# Patient Record
Sex: Female | Born: 1965 | Race: White | Hispanic: No | Marital: Married
Health system: Southern US, Community
[De-identification: ages and names within clinical notes are randomized; demographics above are authoritative.]

---

## 2010-05-23 ENCOUNTER — Encounter: Admission: RE | Admit: 2010-05-23 | Discharge: 2010-05-23 | Payer: Self-pay | Admitting: Unknown Physician Specialty

## 2011-08-18 IMAGING — CR DG FOOT COMPLETE 3+V*L*
3 series · 3 of 3 positions shown · non-contrast
Comparison: None.

CLINICAL DATA: Left foot pain, no known injury, the patient does
play tennis

LEFT FOOT - COMPLETE 3+ VIEW

[view not recorded (1 of 3)]
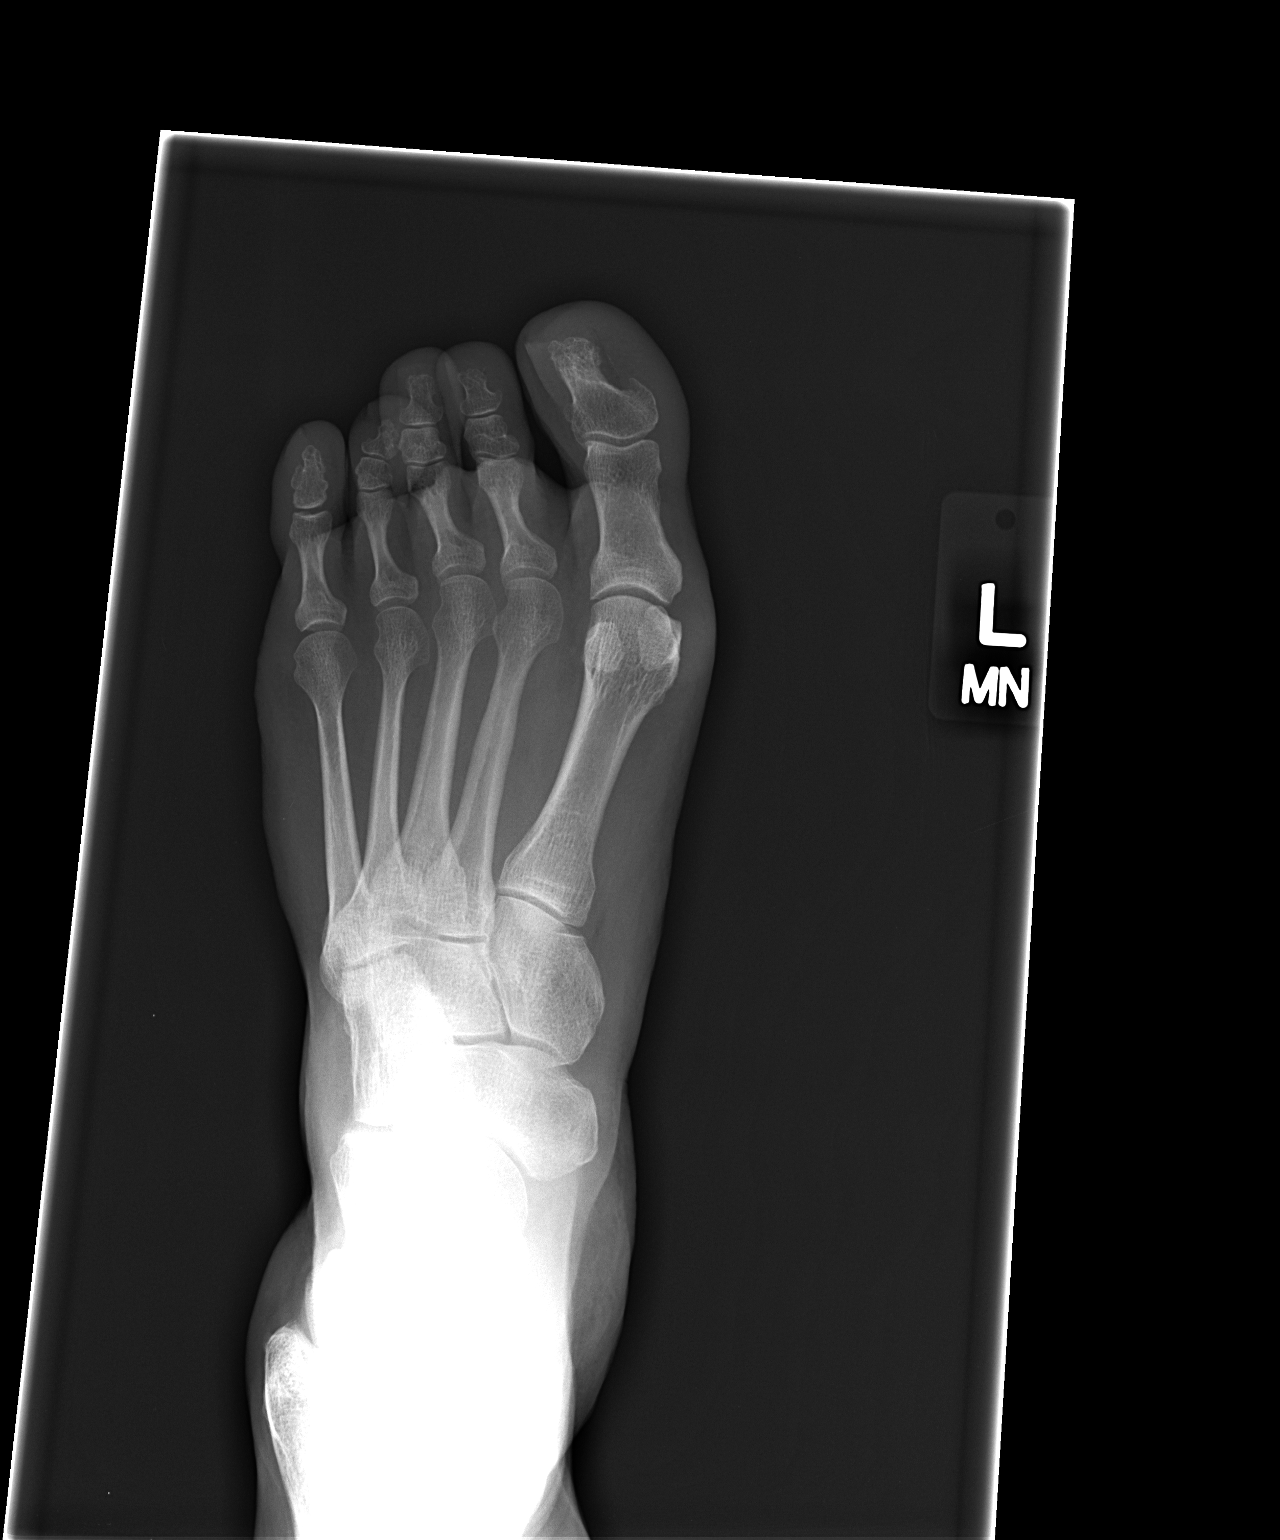

[view not recorded (2 of 3)]
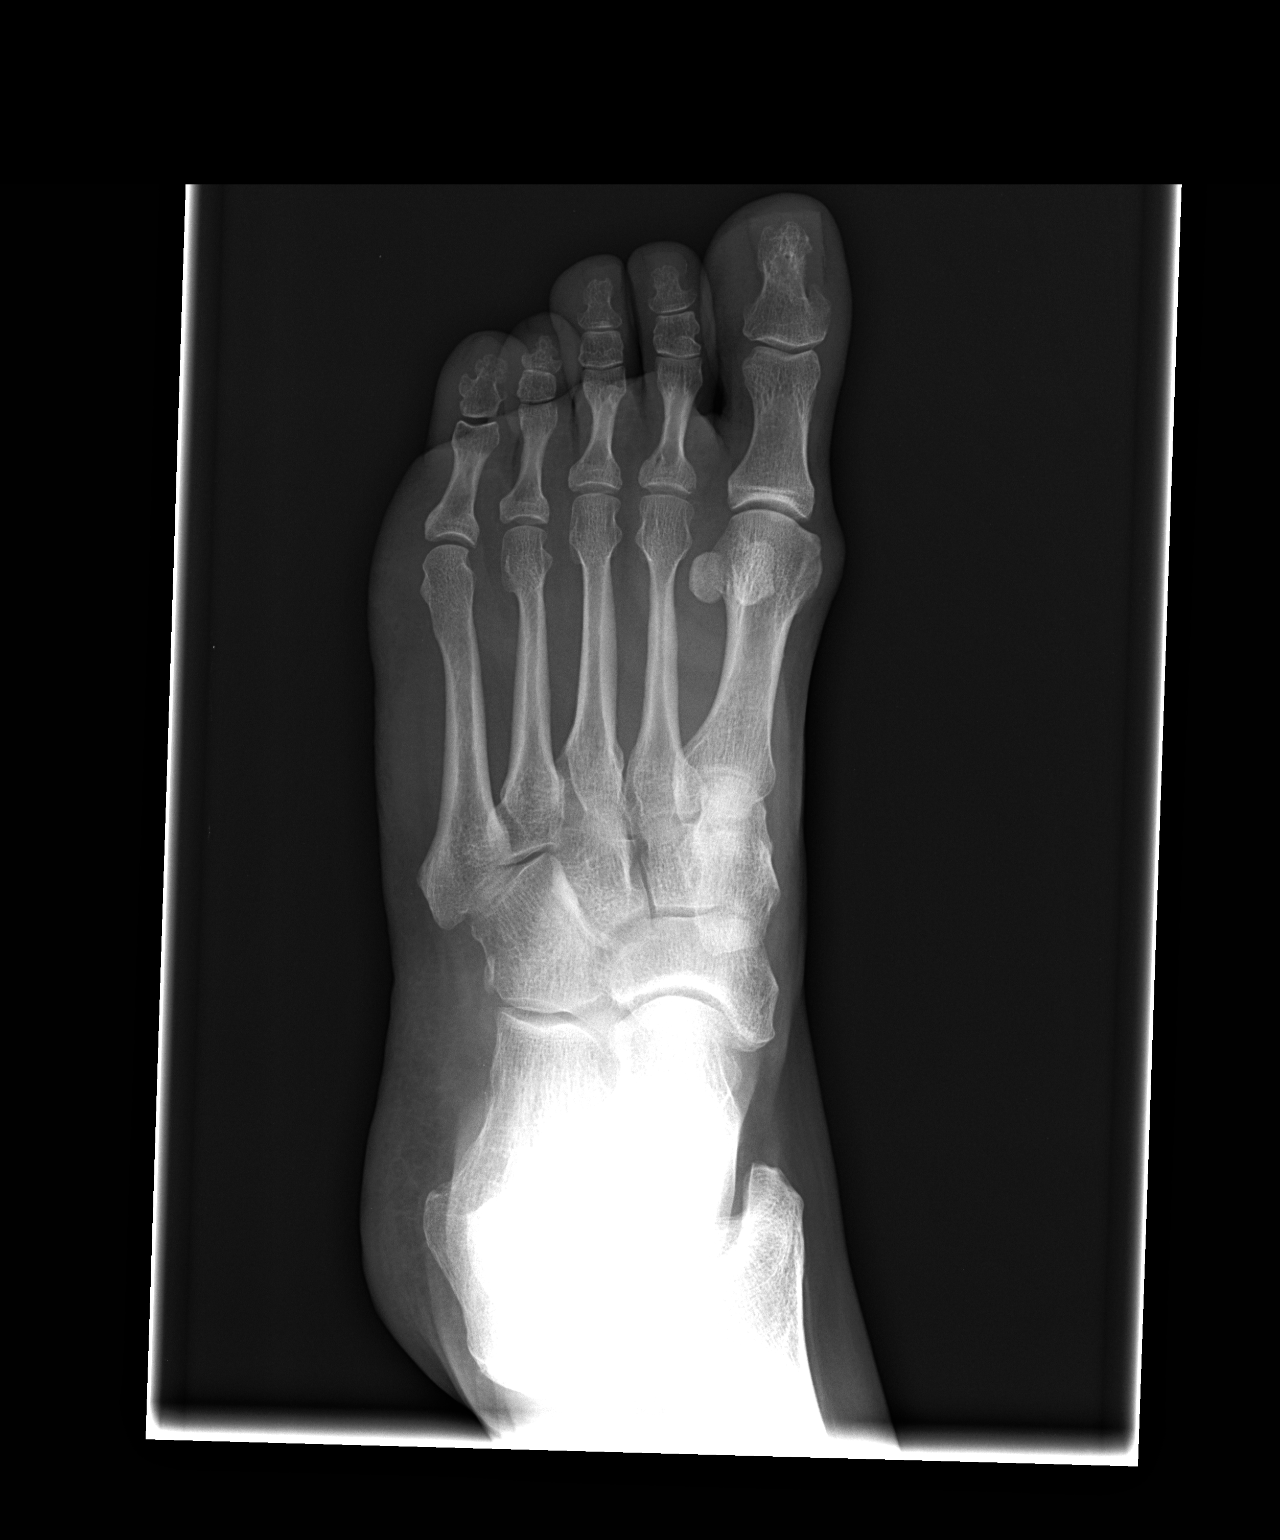

[view not recorded (3 of 3)]
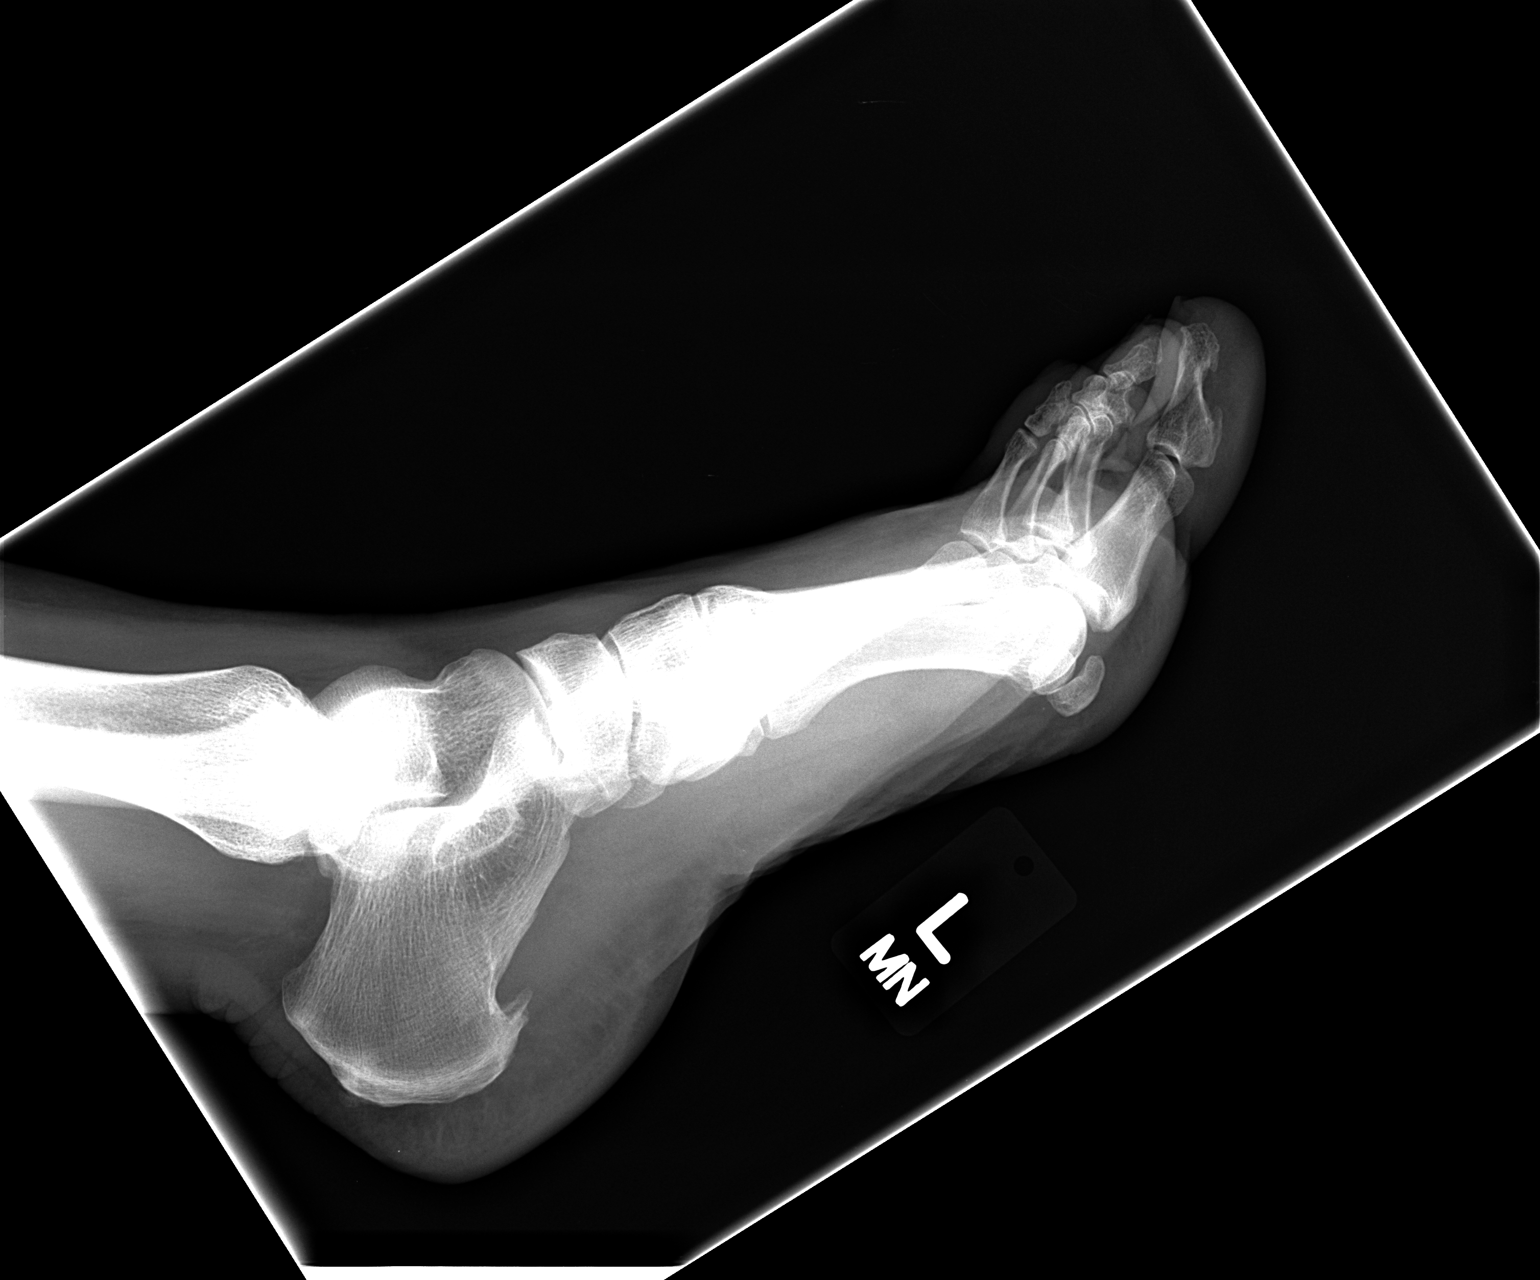

[3 of 3 positions shown; findings below may reference images not displayed]

FINDINGS: No acute fracture is seen.  Tarsal - metatarsal alignment
is normal.  Joint spaces appear normal.  No erosion is seen.  There
is a plantar calcaneal degenerative spur present.
IMPRESSION: No acute abnormality.  Plantar calcaneal degenerative spur.

## 2017-04-16 ENCOUNTER — Other Ambulatory Visit (HOSPITAL_COMMUNITY): Payer: Self-pay

## 2017-04-17 ENCOUNTER — Ambulatory Visit (HOSPITAL_COMMUNITY)
Admission: RE | Admit: 2017-04-17 | Discharge: 2017-04-17 | Disposition: A | Discharge status: Home / Self Care | Payer: Managed Care, Other (non HMO) | Source: Ambulatory Visit | Attending: Nephrology | Admitting: Nephrology

## 2017-04-17 DIAGNOSIS — N189 Chronic kidney disease, unspecified: Secondary | ICD-10-CM | POA: Insufficient documentation

## 2017-04-17 DIAGNOSIS — D631 Anemia in chronic kidney disease: Secondary | ICD-10-CM | POA: Diagnosis not present

## 2017-04-17 MED ORDER — SODIUM CHLORIDE 0.9 % IV SOLN
510.0000 mg | INTRAVENOUS | Status: DC
  Administered 2017-04-17: 510 mg via INTRAVENOUS
  Filled 2017-04-17: qty 17

## 2017-04-17 NOTE — Discharge Instructions (Signed)

## 2017-04-25 ENCOUNTER — Ambulatory Visit (HOSPITAL_COMMUNITY)
Admission: RE | Admit: 2017-04-25 | Discharge: 2017-04-25 | Disposition: A | Discharge status: Home / Self Care | Payer: Managed Care, Other (non HMO) | Source: Ambulatory Visit | Attending: Nephrology | Admitting: Nephrology

## 2017-04-25 DIAGNOSIS — N184 Chronic kidney disease, stage 4 (severe): Secondary | ICD-10-CM | POA: Diagnosis not present

## 2017-04-25 DIAGNOSIS — D631 Anemia in chronic kidney disease: Secondary | ICD-10-CM | POA: Diagnosis not present

## 2017-04-25 MED ORDER — SODIUM CHLORIDE 0.9 % IV SOLN
510.0000 mg | INTRAVENOUS | Status: DC
  Administered 2017-04-25: 510 mg via INTRAVENOUS
  Filled 2017-04-25: qty 17

## 2017-05-08 ENCOUNTER — Other Ambulatory Visit (HOSPITAL_COMMUNITY): Payer: Self-pay | Admitting: *Deleted

## 2017-05-09 ENCOUNTER — Ambulatory Visit (HOSPITAL_COMMUNITY)
Admission: RE | Admit: 2017-05-09 | Discharge: 2017-05-09 | Disposition: A | Discharge status: Home / Self Care | Payer: Managed Care, Other (non HMO) | Source: Ambulatory Visit | Attending: Nephrology | Admitting: Nephrology

## 2017-05-09 ENCOUNTER — Encounter (HOSPITAL_COMMUNITY): Payer: Managed Care, Other (non HMO)

## 2017-05-09 DIAGNOSIS — N189 Chronic kidney disease, unspecified: Secondary | ICD-10-CM | POA: Insufficient documentation

## 2017-05-09 DIAGNOSIS — D631 Anemia in chronic kidney disease: Secondary | ICD-10-CM | POA: Insufficient documentation

## 2017-05-09 LAB — POCT HEMOGLOBIN-HEMACUE: HEMOGLOBIN: 9.4 g/dL — AB (ref 12.0–15.0)

## 2017-05-09 MED ORDER — EPOETIN ALFA 10000 UNIT/ML IJ SOLN
INTRAMUSCULAR | Status: AC
  Administered 2017-05-09: 10000 [IU] via SUBCUTANEOUS
  Filled 2017-05-09: qty 1

## 2017-05-09 MED ORDER — EPOETIN ALFA 10000 UNIT/ML IJ SOLN: 30000.0000 [IU] | INTRAMUSCULAR | Status: DC

## 2017-05-09 MED ORDER — EPOETIN ALFA 20000 UNIT/ML IJ SOLN
INTRAMUSCULAR | Status: AC
  Administered 2017-05-09: 20000 [IU] via SUBCUTANEOUS
  Filled 2017-05-09: qty 1

## 2017-05-09 NOTE — Discharge Instructions (Signed)
Epoetin Alfa injection °What is this medicine? °EPOETIN ALFA (e POE e tin AL fa) helps your body make more red blood cells. This medicine is used to treat anemia caused by chronic kidney failure, cancer chemotherapy, or HIV-therapy. It may also be used before surgery if you have anemia. °This medicine may be used for other purposes; ask your health care provider or pharmacist if you have questions. °COMMON BRAND NAME(S): Epogen, Procrit °What should I tell my health care provider before I take this medicine? °They need to know if you have any of these conditions: °-blood clotting disorders °-cancer patient not on chemotherapy °-cystic fibrosis °-heart disease, such as angina or heart failure °-hemoglobin level of 12 g/dL or greater °-high blood pressure °-low levels of folate, iron, or vitamin B12 °-seizures °-an unusual or allergic reaction to erythropoietin, albumin, benzyl alcohol, hamster proteins, other medicines, foods, dyes, or preservatives °-pregnant or trying to get pregnant °-breast-feeding °How should I use this medicine? °This medicine is for injection into a vein or under the skin. It is usually given by a health care professional in a hospital or clinic setting. °If you get this medicine at home, you will be taught how to prepare and give this medicine. Use exactly as directed. Take your medicine at regular intervals. Do not take your medicine more often than directed. °It is important that you put your used needles and syringes in a special sharps container. Do not put them in a trash can. If you do not have a sharps container, call your pharmacist or healthcare provider to get one. °A special MedGuide will be given to you by the pharmacist with each prescription and refill. Be sure to read this information carefully each time. °Talk to your pediatrician regarding the use of this medicine in children. While this drug may be prescribed for selected conditions, precautions do apply. °Overdosage: If you  think you have taken too much of this medicine contact a poison control center or emergency room at once. °NOTE: This medicine is only for you. Do not share this medicine with others. °What if I miss a dose? °If you miss a dose, take it as soon as you can. If it is almost time for your next dose, take only that dose. Do not take double or extra doses. °What may interact with this medicine? °Do not take this medicine with any of the following medications: °-darbepoetin alfa °This list may not describe all possible interactions. Give your health care provider a list of all the medicines, herbs, non-prescription drugs, or dietary supplements you use. Also tell them if you smoke, drink alcohol, or use illegal drugs. Some items may interact with your medicine. °What should I watch for while using this medicine? °Your condition will be monitored carefully while you are receiving this medicine. °You may need blood work done while you are taking this medicine. °What side effects may I notice from receiving this medicine? °Side effects that you should report to your doctor or health care professional as soon as possible: °-allergic reactions like skin rash, itching or hives, swelling of the face, lips, or tongue °-breathing problems °-changes in vision °-chest pain °-confusion, trouble speaking or understanding °-feeling faint or lightheaded, falls °-high blood pressure °-muscle aches or pains °-pain, swelling, warmth in the leg °-rapid weight gain °-severe headaches °-sudden numbness or weakness of the face, arm or leg °-trouble walking, dizziness, loss of balance or coordination °-seizures (convulsions) °-swelling of the ankles, feet, hands °-unusually weak or tired °  Side effects that usually do not require medical attention (report to your doctor or health care professional if they continue or are bothersome): °-diarrhea °-fever, chills (flu-like symptoms) °-headaches °-nausea, vomiting °-redness, stinging, or swelling at  site where injected °This list may not describe all possible side effects. Call your doctor for medical advice about side effects. You may report side effects to FDA at 1-800-FDA-1088. °Where should I keep my medicine? °Keep out of the reach of children. °Store in a refrigerator between 2 and 8 degrees C (36 and 46 degrees F). Do not freeze or shake. Throw away any unused portion if using a single-dose vial. Multi-dose vials can be kept in the refrigerator for up to 21 days after the initial dose. Throw away unused medicine. °NOTE: This sheet is a summary. It may not cover all possible information. If you have questions about this medicine, talk to your doctor, pharmacist, or health care provider. °© 2018 [employer] (2016-04-23 19:42:31) ° °

## 2017-05-23 ENCOUNTER — Ambulatory Visit (HOSPITAL_COMMUNITY)
Admission: RE | Admit: 2017-05-23 | Discharge: 2017-05-23 | Disposition: A | Discharge status: Home / Self Care | Payer: Managed Care, Other (non HMO) | Source: Ambulatory Visit | Attending: Nephrology | Admitting: Nephrology

## 2017-05-23 DIAGNOSIS — D631 Anemia in chronic kidney disease: Secondary | ICD-10-CM | POA: Diagnosis present

## 2017-05-23 DIAGNOSIS — N189 Chronic kidney disease, unspecified: Secondary | ICD-10-CM | POA: Insufficient documentation

## 2017-05-23 LAB — POCT HEMOGLOBIN-HEMACUE: HEMOGLOBIN: 10.5 g/dL — AB (ref 12.0–15.0)

## 2017-05-23 MED ORDER — EPOETIN ALFA 20000 UNIT/ML IJ SOLN
INTRAMUSCULAR | Status: AC
  Administered 2017-05-23: 20000 [IU] via SUBCUTANEOUS
  Filled 2017-05-23: qty 1

## 2017-05-23 MED ORDER — EPOETIN ALFA 20000 UNIT/ML IJ SOLN: 30000.0000 [IU] | INTRAMUSCULAR | Status: DC

## 2017-05-23 MED ORDER — EPOETIN ALFA 10000 UNIT/ML IJ SOLN
INTRAMUSCULAR | Status: AC
  Administered 2017-05-23: 10000 [IU] via SUBCUTANEOUS
  Filled 2017-05-23: qty 1

## 2017-05-30 ENCOUNTER — Encounter (HOSPITAL_COMMUNITY)
Admission: RE | Admit: 2017-05-30 | Discharge: 2017-05-30 | Disposition: A | Discharge status: Home / Self Care | Payer: Managed Care, Other (non HMO) | Source: Ambulatory Visit | Attending: Nephrology | Admitting: Nephrology

## 2017-05-30 DIAGNOSIS — D631 Anemia in chronic kidney disease: Secondary | ICD-10-CM | POA: Diagnosis present

## 2017-05-30 DIAGNOSIS — N189 Chronic kidney disease, unspecified: Secondary | ICD-10-CM | POA: Insufficient documentation

## 2017-05-30 LAB — POCT HEMOGLOBIN-HEMACUE: HEMOGLOBIN: 11.3 g/dL — AB (ref 12.0–15.0)

## 2017-05-30 MED ORDER — EPOETIN ALFA 20000 UNIT/ML IJ SOLN
INTRAMUSCULAR | Status: AC
  Administered 2017-05-30: 20000 [IU]
  Filled 2017-05-30: qty 1

## 2017-05-30 MED ORDER — EPOETIN ALFA 10000 UNIT/ML IJ SOLN
INTRAMUSCULAR | Status: AC
  Administered 2017-05-30: 10000 [IU]
  Filled 2017-05-30: qty 1

## 2017-05-30 MED ORDER — EPOETIN ALFA 10000 UNIT/ML IJ SOLN: 30000.0000 [IU] | INTRAMUSCULAR | Status: DC

## 2017-06-05 ENCOUNTER — Other Ambulatory Visit (HOSPITAL_COMMUNITY): Payer: Self-pay | Admitting: *Deleted

## 2017-06-06 ENCOUNTER — Encounter (HOSPITAL_COMMUNITY)
Admission: RE | Admit: 2017-06-06 | Discharge: 2017-06-06 | Disposition: A | Discharge status: Home / Self Care | Payer: Managed Care, Other (non HMO) | Source: Ambulatory Visit | Attending: Nephrology | Admitting: Nephrology

## 2017-06-06 DIAGNOSIS — N189 Chronic kidney disease, unspecified: Secondary | ICD-10-CM | POA: Diagnosis not present

## 2017-06-06 LAB — IRON AND TIBC
Iron: 32 ug/dL (ref 28–170)
SATURATION RATIOS: 13 % (ref 10.4–31.8)
TIBC: 249 ug/dL — ABNORMAL LOW (ref 250–450)
UIBC: 217 ug/dL

## 2017-06-06 LAB — FERRITIN: Ferritin: 165 ng/mL (ref 11–307)

## 2017-06-06 LAB — POCT HEMOGLOBIN-HEMACUE: Hemoglobin: 12.6 g/dL (ref 12.0–15.0)

## 2017-06-06 MED ORDER — EPOETIN ALFA 20000 UNIT/ML IJ SOLN: 30000.0000 [IU] | Freq: Once | INTRAMUSCULAR | Status: DC

## 2017-06-13 ENCOUNTER — Encounter: Payer: Self-pay | Admitting: Nephrology

## 2017-06-13 DIAGNOSIS — N189 Chronic kidney disease, unspecified: Secondary | ICD-10-CM

## 2017-06-13 DIAGNOSIS — N184 Chronic kidney disease, stage 4 (severe): Secondary | ICD-10-CM | POA: Insufficient documentation

## 2017-06-13 DIAGNOSIS — Z862 Personal history of diseases of the blood and blood-forming organs and certain disorders involving the immune mechanism: Secondary | ICD-10-CM | POA: Insufficient documentation

## 2017-06-19 ENCOUNTER — Encounter (HOSPITAL_COMMUNITY)
Admission: RE | Admit: 2017-06-19 | Discharge: 2017-06-19 | Disposition: A | Discharge status: Home / Self Care | Payer: Managed Care, Other (non HMO) | Source: Ambulatory Visit | Attending: Nephrology | Admitting: Nephrology

## 2017-06-19 DIAGNOSIS — N189 Chronic kidney disease, unspecified: Secondary | ICD-10-CM | POA: Insufficient documentation

## 2017-06-19 DIAGNOSIS — N184 Chronic kidney disease, stage 4 (severe): Secondary | ICD-10-CM

## 2017-06-19 DIAGNOSIS — D631 Anemia in chronic kidney disease: Secondary | ICD-10-CM | POA: Insufficient documentation

## 2017-06-19 LAB — POCT HEMOGLOBIN-HEMACUE: HEMOGLOBIN: 12.8 g/dL (ref 12.0–15.0)

## 2017-06-19 MED ORDER — EPOETIN ALFA 40000 UNIT/ML IJ SOLN: 30000.0000 [IU] | INTRAMUSCULAR | Status: DC

## 2017-07-03 ENCOUNTER — Encounter (HOSPITAL_COMMUNITY)
Admission: RE | Admit: 2017-07-03 | Discharge: 2017-07-03 | Disposition: A | Discharge status: Home / Self Care | Payer: Managed Care, Other (non HMO) | Source: Ambulatory Visit | Attending: Nephrology | Admitting: Nephrology

## 2017-07-03 DIAGNOSIS — N184 Chronic kidney disease, stage 4 (severe): Secondary | ICD-10-CM

## 2017-07-03 DIAGNOSIS — N189 Chronic kidney disease, unspecified: Secondary | ICD-10-CM | POA: Diagnosis not present

## 2017-07-03 LAB — IRON AND TIBC
IRON: 48 ug/dL (ref 28–170)
SATURATION RATIOS: 20 % (ref 10.4–31.8)
TIBC: 237 ug/dL — AB (ref 250–450)
UIBC: 189 ug/dL

## 2017-07-03 LAB — POCT HEMOGLOBIN-HEMACUE: Hemoglobin: 11.8 g/dL — ABNORMAL LOW (ref 12.0–15.0)

## 2017-07-03 LAB — FERRITIN: Ferritin: 273 ng/mL (ref 11–307)

## 2017-07-03 MED ORDER — EPOETIN ALFA 10000 UNIT/ML IJ SOLN
INTRAMUSCULAR | Status: AC
  Administered 2017-07-03: 15:00:00 10000 [IU] via SUBCUTANEOUS
  Filled 2017-07-03: qty 1

## 2017-07-03 MED ORDER — EPOETIN ALFA 40000 UNIT/ML IJ SOLN: 30000.0000 [IU] | INTRAMUSCULAR | Status: DC

## 2017-07-03 MED ORDER — EPOETIN ALFA 20000 UNIT/ML IJ SOLN
INTRAMUSCULAR | Status: AC
  Administered 2017-07-03: 15:00:00 20000 [IU] via SUBCUTANEOUS
  Filled 2017-07-03: qty 1

## 2017-07-10 ENCOUNTER — Encounter (HOSPITAL_COMMUNITY)
Admission: RE | Admit: 2017-07-10 | Discharge: 2017-07-10 | Disposition: A | Discharge status: Home / Self Care | Payer: Managed Care, Other (non HMO) | Source: Ambulatory Visit | Attending: Nephrology | Admitting: Nephrology

## 2017-07-10 DIAGNOSIS — N189 Chronic kidney disease, unspecified: Secondary | ICD-10-CM | POA: Diagnosis not present

## 2017-07-10 DIAGNOSIS — N184 Chronic kidney disease, stage 4 (severe): Secondary | ICD-10-CM

## 2017-07-10 LAB — POCT HEMOGLOBIN-HEMACUE: Hemoglobin: 12.3 g/dL (ref 12.0–15.0)

## 2017-07-10 MED ORDER — EPOETIN ALFA 40000 UNIT/ML IJ SOLN: 30000.0000 [IU] | INTRAMUSCULAR | Status: DC

## 2017-07-24 ENCOUNTER — Encounter (HOSPITAL_COMMUNITY)
Admission: RE | Admit: 2017-07-24 | Discharge: 2017-07-24 | Disposition: A | Discharge status: Home / Self Care | Payer: Managed Care, Other (non HMO) | Source: Ambulatory Visit | Attending: Nephrology | Admitting: Nephrology

## 2017-07-24 VITALS — BP 131/82 | HR 90 | Temp 97.8°F | Resp 18

## 2017-07-24 DIAGNOSIS — N184 Chronic kidney disease, stage 4 (severe): Secondary | ICD-10-CM

## 2017-07-24 DIAGNOSIS — N189 Chronic kidney disease, unspecified: Secondary | ICD-10-CM | POA: Insufficient documentation

## 2017-07-24 DIAGNOSIS — D631 Anemia in chronic kidney disease: Secondary | ICD-10-CM | POA: Insufficient documentation

## 2017-07-24 LAB — POCT HEMOGLOBIN-HEMACUE: Hemoglobin: 11.9 g/dL — ABNORMAL LOW (ref 12.0–15.0)

## 2017-07-24 MED ORDER — EPOETIN ALFA 40000 UNIT/ML IJ SOLN: 30000.0000 [IU] | INTRAMUSCULAR | Status: DC

## 2017-07-24 MED ORDER — EPOETIN ALFA 20000 UNIT/ML IJ SOLN
INTRAMUSCULAR | Status: AC
  Administered 2017-07-24: 15:00:00 20000 [IU]
  Filled 2017-07-24: qty 1

## 2017-07-24 MED ORDER — EPOETIN ALFA 10000 UNIT/ML IJ SOLN
INTRAMUSCULAR | Status: AC
  Administered 2017-07-24: 10000 [IU]
  Filled 2017-07-24: qty 1

## 2017-08-01 ENCOUNTER — Encounter (HOSPITAL_COMMUNITY)
Admission: RE | Admit: 2017-08-01 | Discharge: 2017-08-01 | Disposition: A | Discharge status: Home / Self Care | Payer: Managed Care, Other (non HMO) | Source: Ambulatory Visit | Attending: Nephrology | Admitting: Nephrology

## 2017-08-01 VITALS — BP 129/88 | HR 79 | Temp 98.2°F | Resp 18

## 2017-08-01 DIAGNOSIS — N189 Chronic kidney disease, unspecified: Secondary | ICD-10-CM | POA: Diagnosis not present

## 2017-08-01 DIAGNOSIS — N184 Chronic kidney disease, stage 4 (severe): Secondary | ICD-10-CM

## 2017-08-01 LAB — IRON AND TIBC
Iron: 53 ug/dL (ref 28–170)
Saturation Ratios: 21 % (ref 10.4–31.8)
TIBC: 256 ug/dL (ref 250–450)
UIBC: 203 ug/dL

## 2017-08-01 LAB — FERRITIN: FERRITIN: 162 ng/mL (ref 11–307)

## 2017-08-01 LAB — POCT HEMOGLOBIN-HEMACUE: Hemoglobin: 12.7 g/dL (ref 12.0–15.0)

## 2017-08-01 MED ORDER — EPOETIN ALFA 40000 UNIT/ML IJ SOLN: 30000.0000 [IU] | INTRAMUSCULAR | Status: DC

## 2017-08-15 ENCOUNTER — Encounter (HOSPITAL_COMMUNITY)
Admission: RE | Admit: 2017-08-15 | Discharge: 2017-08-15 | Disposition: A | Discharge status: Home / Self Care | Payer: Managed Care, Other (non HMO) | Source: Ambulatory Visit | Attending: Nephrology | Admitting: Nephrology

## 2017-08-15 VITALS — BP 133/89 | HR 98 | Resp 18

## 2017-08-15 DIAGNOSIS — N189 Chronic kidney disease, unspecified: Secondary | ICD-10-CM | POA: Diagnosis not present

## 2017-08-15 DIAGNOSIS — N184 Chronic kidney disease, stage 4 (severe): Secondary | ICD-10-CM

## 2017-08-15 LAB — POCT HEMOGLOBIN-HEMACUE: HEMOGLOBIN: 11.9 g/dL — AB (ref 12.0–15.0)

## 2017-08-15 MED ORDER — EPOETIN ALFA 40000 UNIT/ML IJ SOLN: 30000.0000 [IU] | INTRAMUSCULAR | Status: DC

## 2017-08-15 MED ORDER — EPOETIN ALFA 20000 UNIT/ML IJ SOLN
INTRAMUSCULAR | Status: AC
  Administered 2017-08-15: 15:00:00 20000 [IU] via SUBCUTANEOUS
  Filled 2017-08-15: qty 1

## 2017-08-15 MED ORDER — EPOETIN ALFA 10000 UNIT/ML IJ SOLN
INTRAMUSCULAR | Status: AC
  Administered 2017-08-15: 10000 [IU] via SUBCUTANEOUS
  Filled 2017-08-15: qty 1

## 2017-08-23 ENCOUNTER — Other Ambulatory Visit (HOSPITAL_COMMUNITY): Payer: Self-pay | Admitting: *Deleted

## 2017-08-26 ENCOUNTER — Encounter (HOSPITAL_COMMUNITY): Payer: Managed Care, Other (non HMO)

## 2017-08-29 ENCOUNTER — Encounter (HOSPITAL_COMMUNITY)
Admission: RE | Admit: 2017-08-29 | Discharge: 2017-08-29 | Disposition: A | Discharge status: Home / Self Care | Payer: Managed Care, Other (non HMO) | Source: Ambulatory Visit | Attending: Nephrology | Admitting: Nephrology

## 2017-08-29 VITALS — BP 127/74 | HR 94 | Temp 97.4°F | Resp 18

## 2017-08-29 DIAGNOSIS — N189 Chronic kidney disease, unspecified: Secondary | ICD-10-CM | POA: Insufficient documentation

## 2017-08-29 DIAGNOSIS — N184 Chronic kidney disease, stage 4 (severe): Secondary | ICD-10-CM

## 2017-08-29 DIAGNOSIS — D631 Anemia in chronic kidney disease: Secondary | ICD-10-CM | POA: Insufficient documentation

## 2017-08-29 LAB — IRON AND TIBC
IRON: 79 ug/dL (ref 28–170)
Saturation Ratios: 28 % (ref 10.4–31.8)
TIBC: 287 ug/dL (ref 250–450)
UIBC: 208 ug/dL

## 2017-08-29 LAB — POCT HEMOGLOBIN-HEMACUE: Hemoglobin: 12 g/dL (ref 12.0–15.0)

## 2017-08-29 LAB — FERRITIN: Ferritin: 219 ng/mL (ref 11–307)

## 2017-08-29 MED ORDER — EPOETIN ALFA 40000 UNIT/ML IJ SOLN: 30000.0000 [IU] | INTRAMUSCULAR | Status: DC

## 2017-09-12 ENCOUNTER — Encounter (HOSPITAL_COMMUNITY)
Admission: RE | Admit: 2017-09-12 | Discharge: 2017-09-12 | Disposition: A | Discharge status: Home / Self Care | Payer: Managed Care, Other (non HMO) | Source: Ambulatory Visit | Attending: Nephrology | Admitting: Nephrology

## 2017-09-12 VITALS — BP 120/80 | HR 88 | Resp 18

## 2017-09-12 DIAGNOSIS — N189 Chronic kidney disease, unspecified: Secondary | ICD-10-CM | POA: Diagnosis not present

## 2017-09-12 DIAGNOSIS — N184 Chronic kidney disease, stage 4 (severe): Secondary | ICD-10-CM

## 2017-09-12 LAB — POCT HEMOGLOBIN-HEMACUE: HEMOGLOBIN: 11.9 g/dL — AB (ref 12.0–15.0)

## 2017-09-12 MED ORDER — EPOETIN ALFA 20000 UNIT/ML IJ SOLN
INTRAMUSCULAR | Status: AC
  Administered 2017-09-12: 10:00:00 20000 [IU]
  Filled 2017-09-12: qty 1

## 2017-09-12 MED ORDER — EPOETIN ALFA 40000 UNIT/ML IJ SOLN: 30000.0000 [IU] | INTRAMUSCULAR | Status: DC

## 2017-09-12 MED ORDER — EPOETIN ALFA 10000 UNIT/ML IJ SOLN
INTRAMUSCULAR | Status: AC
  Administered 2017-09-12: 10:00:00 10000 [IU]
  Filled 2017-09-12: qty 1

## 2017-09-13 ENCOUNTER — Encounter (HOSPITAL_COMMUNITY): Payer: Managed Care, Other (non HMO)

## 2017-09-16 ENCOUNTER — Other Ambulatory Visit (HOSPITAL_COMMUNITY): Payer: Self-pay | Admitting: *Deleted

## 2017-09-18 ENCOUNTER — Encounter (HOSPITAL_COMMUNITY)
Admission: RE | Admit: 2017-09-18 | Discharge: 2017-09-18 | Disposition: A | Discharge status: Home / Self Care | Payer: Managed Care, Other (non HMO) | Source: Ambulatory Visit | Attending: Nephrology | Admitting: Nephrology

## 2017-09-18 VITALS — BP 130/81 | HR 99 | Temp 97.8°F | Resp 18

## 2017-09-18 DIAGNOSIS — D631 Anemia in chronic kidney disease: Secondary | ICD-10-CM | POA: Insufficient documentation

## 2017-09-18 DIAGNOSIS — N189 Chronic kidney disease, unspecified: Secondary | ICD-10-CM | POA: Diagnosis not present

## 2017-09-18 DIAGNOSIS — N184 Chronic kidney disease, stage 4 (severe): Secondary | ICD-10-CM

## 2017-09-18 LAB — POCT HEMOGLOBIN-HEMACUE: HEMOGLOBIN: 12.1 g/dL (ref 12.0–15.0)

## 2017-09-18 MED ORDER — EPOETIN ALFA 40000 UNIT/ML IJ SOLN: 30000.0000 [IU] | INTRAMUSCULAR | Status: DC

## 2017-09-18 MED ORDER — EPOETIN ALFA 20000 UNIT/ML IJ SOLN
INTRAMUSCULAR | Status: AC
  Filled 2017-09-18: qty 1

## 2017-09-18 MED ORDER — EPOETIN ALFA 10000 UNIT/ML IJ SOLN
INTRAMUSCULAR | Status: AC
  Filled 2017-09-18: qty 1

## 2017-10-02 ENCOUNTER — Encounter (HOSPITAL_COMMUNITY)
Admission: RE | Admit: 2017-10-02 | Discharge: 2017-10-02 | Disposition: A | Discharge status: Home / Self Care | Payer: Managed Care, Other (non HMO) | Source: Ambulatory Visit | Attending: Nephrology | Admitting: Nephrology

## 2017-10-02 VITALS — BP 119/74 | HR 97 | Temp 98.0°F | Resp 20

## 2017-10-02 DIAGNOSIS — N189 Chronic kidney disease, unspecified: Secondary | ICD-10-CM | POA: Diagnosis not present

## 2017-10-02 DIAGNOSIS — N184 Chronic kidney disease, stage 4 (severe): Secondary | ICD-10-CM

## 2017-10-02 LAB — POCT HEMOGLOBIN-HEMACUE: HEMOGLOBIN: 11.2 g/dL — AB (ref 12.0–15.0)

## 2017-10-02 LAB — IRON AND TIBC
Iron: 68 ug/dL (ref 28–170)
Saturation Ratios: 28 % (ref 10.4–31.8)
TIBC: 245 ug/dL — ABNORMAL LOW (ref 250–450)
UIBC: 177 ug/dL

## 2017-10-02 LAB — FERRITIN: FERRITIN: 216 ng/mL (ref 11–307)

## 2017-10-02 MED ORDER — EPOETIN ALFA 10000 UNIT/ML IJ SOLN
INTRAMUSCULAR | Status: AC
  Administered 2017-10-02: 15:00:00 10000 [IU] via SUBCUTANEOUS
  Filled 2017-10-02: qty 1

## 2017-10-02 MED ORDER — EPOETIN ALFA 20000 UNIT/ML IJ SOLN
INTRAMUSCULAR | Status: AC
  Administered 2017-10-02: 15:00:00 20000 [IU] via SUBCUTANEOUS
  Filled 2017-10-02: qty 1

## 2017-10-02 MED ORDER — EPOETIN ALFA 40000 UNIT/ML IJ SOLN: 30000.0000 [IU] | INTRAMUSCULAR | Status: DC

## 2017-10-09 ENCOUNTER — Encounter (HOSPITAL_COMMUNITY)
Admission: RE | Admit: 2017-10-09 | Discharge: 2017-10-09 | Disposition: A | Discharge status: Home / Self Care | Payer: Managed Care, Other (non HMO) | Source: Ambulatory Visit | Attending: Nephrology | Admitting: Nephrology

## 2017-10-09 VITALS — BP 122/79 | HR 85 | Resp 16

## 2017-10-09 DIAGNOSIS — N189 Chronic kidney disease, unspecified: Secondary | ICD-10-CM | POA: Diagnosis not present

## 2017-10-09 DIAGNOSIS — N184 Chronic kidney disease, stage 4 (severe): Secondary | ICD-10-CM

## 2017-10-09 LAB — POCT HEMOGLOBIN-HEMACUE: HEMOGLOBIN: 11.9 g/dL — AB (ref 12.0–15.0)

## 2017-10-09 MED ORDER — EPOETIN ALFA 10000 UNIT/ML IJ SOLN
INTRAMUSCULAR | Status: AC
  Administered 2017-10-09: 15:00:00 10000 [IU]
  Filled 2017-10-09: qty 1

## 2017-10-09 MED ORDER — EPOETIN ALFA 20000 UNIT/ML IJ SOLN
INTRAMUSCULAR | Status: AC
  Administered 2017-10-09: 15:00:00 20000 [IU]
  Filled 2017-10-09: qty 1

## 2017-10-09 MED ORDER — EPOETIN ALFA 40000 UNIT/ML IJ SOLN: 30000.0000 [IU] | INTRAMUSCULAR | Status: DC

## 2017-10-16 ENCOUNTER — Encounter (HOSPITAL_COMMUNITY)
Admission: RE | Admit: 2017-10-16 | Discharge: 2017-10-16 | Disposition: A | Discharge status: Home / Self Care | Payer: Managed Care, Other (non HMO) | Source: Ambulatory Visit | Attending: Nephrology | Admitting: Nephrology

## 2017-10-16 VITALS — BP 120/76 | HR 86 | Temp 97.8°F | Resp 18

## 2017-10-16 DIAGNOSIS — N184 Chronic kidney disease, stage 4 (severe): Secondary | ICD-10-CM

## 2017-10-16 DIAGNOSIS — N189 Chronic kidney disease, unspecified: Secondary | ICD-10-CM | POA: Diagnosis not present

## 2017-10-16 MED ORDER — EPOETIN ALFA 40000 UNIT/ML IJ SOLN: 30000.0000 [IU] | INTRAMUSCULAR | Status: DC

## 2017-10-17 LAB — POCT HEMOGLOBIN-HEMACUE: Hemoglobin: 12.7 g/dL (ref 12.0–15.0)

## 2017-10-31 ENCOUNTER — Encounter (HOSPITAL_COMMUNITY)
Admission: RE | Admit: 2017-10-31 | Discharge: 2017-10-31 | Disposition: A | Discharge status: Home / Self Care | Payer: Managed Care, Other (non HMO) | Source: Ambulatory Visit | Attending: Nephrology | Admitting: Nephrology

## 2017-10-31 VITALS — BP 118/75 | HR 89 | Temp 98.6°F | Resp 18

## 2017-10-31 DIAGNOSIS — D631 Anemia in chronic kidney disease: Secondary | ICD-10-CM | POA: Insufficient documentation

## 2017-10-31 DIAGNOSIS — N189 Chronic kidney disease, unspecified: Secondary | ICD-10-CM | POA: Insufficient documentation

## 2017-10-31 DIAGNOSIS — N184 Chronic kidney disease, stage 4 (severe): Secondary | ICD-10-CM

## 2017-10-31 LAB — IRON AND TIBC
IRON: 76 ug/dL (ref 28–170)
SATURATION RATIOS: 28 % (ref 10.4–31.8)
TIBC: 269 ug/dL (ref 250–450)
UIBC: 193 ug/dL

## 2017-10-31 LAB — POCT HEMOGLOBIN-HEMACUE: Hemoglobin: 12.4 g/dL (ref 12.0–15.0)

## 2017-10-31 LAB — FERRITIN: Ferritin: 190 ng/mL (ref 11–307)

## 2017-10-31 MED ORDER — EPOETIN ALFA 40000 UNIT/ML IJ SOLN: 30000.0000 [IU] | INTRAMUSCULAR | Status: DC

## 2017-11-14 ENCOUNTER — Encounter (HOSPITAL_COMMUNITY)
Admission: RE | Admit: 2017-11-14 | Discharge: 2017-11-14 | Disposition: A | Discharge status: Home / Self Care | Payer: Managed Care, Other (non HMO) | Source: Ambulatory Visit | Attending: Nephrology | Admitting: Nephrology

## 2017-11-14 VITALS — BP 128/84 | HR 88 | Resp 18

## 2017-11-14 DIAGNOSIS — N189 Chronic kidney disease, unspecified: Secondary | ICD-10-CM | POA: Diagnosis not present

## 2017-11-14 DIAGNOSIS — N184 Chronic kidney disease, stage 4 (severe): Secondary | ICD-10-CM

## 2017-11-14 LAB — POCT HEMOGLOBIN-HEMACUE: Hemoglobin: 11.5 g/dL — ABNORMAL LOW (ref 12.0–15.0)

## 2017-11-14 MED ORDER — EPOETIN ALFA 40000 UNIT/ML IJ SOLN: 30000.0000 [IU] | INTRAMUSCULAR | Status: DC

## 2017-11-14 MED ORDER — EPOETIN ALFA 10000 UNIT/ML IJ SOLN
INTRAMUSCULAR | Status: AC
  Administered 2017-11-14: 15:00:00 10000 [IU]
  Filled 2017-11-14: qty 1

## 2017-11-14 MED ORDER — EPOETIN ALFA 20000 UNIT/ML IJ SOLN
INTRAMUSCULAR | Status: AC
  Administered 2017-11-14: 15:00:00 20000 [IU]
  Filled 2017-11-14: qty 1

## 2017-11-20 ENCOUNTER — Encounter (HOSPITAL_COMMUNITY): Payer: Managed Care, Other (non HMO)

## 2017-11-21 ENCOUNTER — Encounter (HOSPITAL_COMMUNITY): Payer: Managed Care, Other (non HMO)

## 2017-11-27 ENCOUNTER — Encounter (HOSPITAL_COMMUNITY)
Admission: RE | Admit: 2017-11-27 | Discharge: 2017-11-27 | Disposition: A | Discharge status: Home / Self Care | Payer: Managed Care, Other (non HMO) | Source: Ambulatory Visit | Attending: Nephrology | Admitting: Nephrology

## 2017-11-27 VITALS — BP 110/71 | HR 78 | Temp 98.2°F | Resp 18

## 2017-11-27 DIAGNOSIS — D631 Anemia in chronic kidney disease: Secondary | ICD-10-CM | POA: Insufficient documentation

## 2017-11-27 DIAGNOSIS — N184 Chronic kidney disease, stage 4 (severe): Secondary | ICD-10-CM

## 2017-11-27 DIAGNOSIS — N189 Chronic kidney disease, unspecified: Secondary | ICD-10-CM | POA: Insufficient documentation

## 2017-11-27 LAB — FERRITIN: FERRITIN: 165 ng/mL (ref 11–307)

## 2017-11-27 LAB — IRON AND TIBC
IRON: 68 ug/dL (ref 28–170)
Saturation Ratios: 28 % (ref 10.4–31.8)
TIBC: 244 ug/dL — AB (ref 250–450)
UIBC: 176 ug/dL

## 2017-11-27 LAB — POCT HEMOGLOBIN-HEMACUE: HEMOGLOBIN: 12.3 g/dL (ref 12.0–15.0)

## 2017-11-27 MED ORDER — EPOETIN ALFA 40000 UNIT/ML IJ SOLN: 30000.0000 [IU] | INTRAMUSCULAR | Status: DC

## 2017-12-04 ENCOUNTER — Encounter (HOSPITAL_COMMUNITY): Payer: Managed Care, Other (non HMO)

## 2017-12-11 ENCOUNTER — Encounter (HOSPITAL_COMMUNITY): Payer: Managed Care, Other (non HMO)

## 2017-12-12 ENCOUNTER — Ambulatory Visit (HOSPITAL_COMMUNITY)
Admission: RE | Admit: 2017-12-12 | Discharge: 2017-12-12 | Disposition: A | Discharge status: Home / Self Care | Payer: Managed Care, Other (non HMO) | Source: Ambulatory Visit | Attending: Nephrology | Admitting: Nephrology

## 2017-12-12 VITALS — BP 136/92 | HR 88 | Resp 18

## 2017-12-12 DIAGNOSIS — N184 Chronic kidney disease, stage 4 (severe): Secondary | ICD-10-CM | POA: Diagnosis not present

## 2017-12-12 LAB — POCT HEMOGLOBIN-HEMACUE: HEMOGLOBIN: 12.1 g/dL (ref 12.0–15.0)

## 2017-12-12 MED ORDER — EPOETIN ALFA 40000 UNIT/ML IJ SOLN: 30000.0000 [IU] | INTRAMUSCULAR | Status: DC

## 2017-12-19 ENCOUNTER — Encounter (HOSPITAL_COMMUNITY): Payer: Managed Care, Other (non HMO)

## 2017-12-25 ENCOUNTER — Ambulatory Visit (HOSPITAL_COMMUNITY)
Admission: RE | Admit: 2017-12-25 | Discharge: 2017-12-25 | Disposition: A | Discharge status: Home / Self Care | Payer: Managed Care, Other (non HMO) | Source: Ambulatory Visit | Attending: Nephrology | Admitting: Nephrology

## 2017-12-25 VITALS — BP 121/75 | HR 87 | Temp 98.2°F | Resp 18

## 2017-12-25 DIAGNOSIS — N184 Chronic kidney disease, stage 4 (severe): Secondary | ICD-10-CM | POA: Diagnosis not present

## 2017-12-25 DIAGNOSIS — D631 Anemia in chronic kidney disease: Secondary | ICD-10-CM | POA: Diagnosis present

## 2017-12-25 LAB — IRON AND TIBC
Iron: 73 ug/dL (ref 28–170)
Saturation Ratios: 30 % (ref 10.4–31.8)
TIBC: 244 ug/dL — ABNORMAL LOW (ref 250–450)
UIBC: 171 ug/dL

## 2017-12-25 LAB — POCT HEMOGLOBIN-HEMACUE: Hemoglobin: 10.7 g/dL — ABNORMAL LOW (ref 12.0–15.0)

## 2017-12-25 LAB — FERRITIN: FERRITIN: 241 ng/mL (ref 11–307)

## 2017-12-25 MED ORDER — EPOETIN ALFA 10000 UNIT/ML IJ SOLN
INTRAMUSCULAR | Status: AC
  Administered 2017-12-25: 10000 [IU]
  Filled 2017-12-25: qty 1

## 2017-12-25 MED ORDER — EPOETIN ALFA 40000 UNIT/ML IJ SOLN: 30000.0000 [IU] | INTRAMUSCULAR | Status: DC

## 2017-12-25 MED ORDER — EPOETIN ALFA 20000 UNIT/ML IJ SOLN
INTRAMUSCULAR | Status: AC
  Administered 2017-12-25: 20000 [IU]
  Filled 2017-12-25: qty 1

## 2017-12-27 LAB — POCT HEMOGLOBIN-HEMACUE
HEMOGLOBIN: 10.7 g/dL — AB (ref 12.0–15.0)
Hemoglobin: 11.3 g/dL — ABNORMAL LOW (ref 12.0–15.0)
Hemoglobin: 10.2 g/dL — ABNORMAL LOW (ref 12.0–15.0)

## 2018-01-01 ENCOUNTER — Encounter (HOSPITAL_COMMUNITY): Payer: Managed Care, Other (non HMO)

## 2018-01-02 ENCOUNTER — Telehealth: Payer: Self-pay

## 2018-01-02 NOTE — Telephone Encounter (Signed)
Sent referral to scheduling 

## 2018-01-08 ENCOUNTER — Ambulatory Visit (HOSPITAL_COMMUNITY)
Admission: RE | Admit: 2018-01-08 | Discharge: 2018-01-08 | Disposition: A | Discharge status: Home / Self Care | Payer: Managed Care, Other (non HMO) | Source: Ambulatory Visit | Attending: Nephrology | Admitting: Nephrology

## 2018-01-08 VITALS — BP 120/87 | HR 86 | Temp 98.3°F | Resp 18

## 2018-01-08 DIAGNOSIS — D631 Anemia in chronic kidney disease: Secondary | ICD-10-CM | POA: Insufficient documentation

## 2018-01-08 DIAGNOSIS — N184 Chronic kidney disease, stage 4 (severe): Secondary | ICD-10-CM | POA: Diagnosis not present

## 2018-01-08 LAB — POCT HEMOGLOBIN-HEMACUE: HEMOGLOBIN: 11.6 g/dL — AB (ref 12.0–15.0)

## 2018-01-08 MED ORDER — EPOETIN ALFA 10000 UNIT/ML IJ SOLN
INTRAMUSCULAR | Status: AC
  Administered 2018-01-08: 10000 [IU]
  Filled 2018-01-08: qty 1

## 2018-01-08 MED ORDER — EPOETIN ALFA 20000 UNIT/ML IJ SOLN
INTRAMUSCULAR | Status: AC
  Administered 2018-01-08: 15:00:00 20000 [IU]
  Filled 2018-01-08: qty 1

## 2018-01-08 MED ORDER — EPOETIN ALFA 40000 UNIT/ML IJ SOLN
30000.0000 [IU] | INTRAMUSCULAR | Status: DC
Start: 2018-01-08 — End: 2018-01-08

## 2018-01-09 ENCOUNTER — Other Ambulatory Visit: Payer: Self-pay | Admitting: Nephrology

## 2018-01-09 DIAGNOSIS — Z1231 Encounter for screening mammogram for malignant neoplasm of breast: Secondary | ICD-10-CM

## 2018-01-14 ENCOUNTER — Other Ambulatory Visit (HOSPITAL_COMMUNITY): Payer: Self-pay | Admitting: Nephrology

## 2018-01-14 DIAGNOSIS — Z7682 Awaiting organ transplant status: Secondary | ICD-10-CM

## 2018-01-15 ENCOUNTER — Ambulatory Visit (HOSPITAL_COMMUNITY)
Admission: RE | Admit: 2018-01-15 | Discharge: 2018-01-15 | Disposition: A | Discharge status: Home / Self Care | Payer: Managed Care, Other (non HMO) | Source: Ambulatory Visit | Attending: Nephrology | Admitting: Nephrology

## 2018-01-15 ENCOUNTER — Telehealth (HOSPITAL_COMMUNITY): Payer: Self-pay | Admitting: Nephrology

## 2018-01-15 VITALS — BP 126/85 | HR 76 | Resp 18

## 2018-01-15 DIAGNOSIS — D631 Anemia in chronic kidney disease: Secondary | ICD-10-CM | POA: Insufficient documentation

## 2018-01-15 DIAGNOSIS — N184 Chronic kidney disease, stage 4 (severe): Secondary | ICD-10-CM | POA: Diagnosis present

## 2018-01-15 MED ORDER — EPOETIN ALFA 40000 UNIT/ML IJ SOLN: 30000.0000 [IU] | INTRAMUSCULAR | Status: DC

## 2018-01-16 ENCOUNTER — Telehealth (HOSPITAL_COMMUNITY): Payer: Self-pay | Admitting: *Deleted

## 2018-01-16 LAB — POCT HEMOGLOBIN-HEMACUE: HEMOGLOBIN: 12 g/dL (ref 12.0–15.0)

## 2018-01-16 NOTE — Telephone Encounter (Signed)
Left message on voicemail in reference to upcoming appointment scheduled for 01/22/18. Phone number given for a call back so details instructions can be given. Regina Byrd

## 2018-01-21 ENCOUNTER — Ambulatory Visit (HOSPITAL_COMMUNITY): Payer: Managed Care, Other (non HMO) | Attending: Cardiovascular Disease

## 2018-01-21 ENCOUNTER — Telehealth (HOSPITAL_COMMUNITY): Payer: Self-pay | Admitting: Nephrology

## 2018-01-21 ENCOUNTER — Other Ambulatory Visit: Payer: Self-pay

## 2018-01-21 DIAGNOSIS — D631 Anemia in chronic kidney disease: Secondary | ICD-10-CM | POA: Diagnosis not present

## 2018-01-21 DIAGNOSIS — E785 Hyperlipidemia, unspecified: Secondary | ICD-10-CM | POA: Insufficient documentation

## 2018-01-21 DIAGNOSIS — Z8639 Personal history of other endocrine, nutritional and metabolic disease: Secondary | ICD-10-CM | POA: Diagnosis not present

## 2018-01-21 DIAGNOSIS — Z7682 Awaiting organ transplant status: Secondary | ICD-10-CM | POA: Diagnosis not present

## 2018-01-21 DIAGNOSIS — N184 Chronic kidney disease, stage 4 (severe): Secondary | ICD-10-CM | POA: Insufficient documentation

## 2018-01-21 DIAGNOSIS — I129 Hypertensive chronic kidney disease with stage 1 through stage 4 chronic kidney disease, or unspecified chronic kidney disease: Secondary | ICD-10-CM | POA: Diagnosis not present

## 2018-01-21 DIAGNOSIS — N281 Cyst of kidney, acquired: Secondary | ICD-10-CM | POA: Insufficient documentation

## 2018-01-21 NOTE — Telephone Encounter (Signed)
User: Cherie Dark A Date/time: 01/17/18 2:55 PM  Comment: Called pt and lmsg for her to CB to r/s echo .Marland KitchenRG  Context:  Outcome: Left Message  Phone number: [phone] Phone Type: Home Phone  Comm. type: Telephone Call type: Outgoing  Contact: Lynwood Dawley Relation to patient: Self    User: Cherie Dark A Date/time: 01/17/18 10:24 AM  Comment: Called pt and lmsg for her to CB to r/s echo appt on 01/22/18.Marland KitchenRG  Context:  Outcome: Left Message  Phone number: [phone] Phone Type: Home Phone  Comm. type: Telephone Call type: Outgoing  Contact: Lynwood Dawley Relation to patient: Self    User: Cherie Dark A Date/time: 01/15/18 11:49 AM  Comment: Called pt and lmsg for her to Cb to move echo appt to another day   Context:  Outcome: Left Message  Phone number: [phone] Phone Type: Home Phone  Comm. type: Telephone Call type: Outgoing  Contact: Lynwood Dawley Relation to patient: Self

## 2018-01-22 ENCOUNTER — Other Ambulatory Visit (HOSPITAL_COMMUNITY): Payer: Managed Care, Other (non HMO)

## 2018-01-22 NOTE — Telephone Encounter (Signed)
User: Cherie Dark A Date/time: 01/21/18 3:39 PM  Comment: Called Deanna twice today to check to see if authorization had been started for patient's stress echo appt that is tomorrow at 2:30. I left 2 messages and is currently awaiting a response.   Context:  Outcome: Left Message  Phone number: [phone] Phone Type:   Comm. type: Telephone Call type: Outgoing  Contact: Greenwald Kidney Relation to patient: Provider

## 2018-01-29 ENCOUNTER — Encounter (HOSPITAL_COMMUNITY)
Admission: RE | Admit: 2018-01-29 | Discharge: 2018-01-29 | Disposition: A | Discharge status: Home / Self Care | Payer: Managed Care, Other (non HMO) | Source: Ambulatory Visit | Attending: Nephrology | Admitting: Nephrology

## 2018-01-29 VITALS — BP 135/91 | HR 73 | Temp 97.9°F | Resp 18

## 2018-01-29 DIAGNOSIS — D631 Anemia in chronic kidney disease: Secondary | ICD-10-CM | POA: Diagnosis present

## 2018-01-29 DIAGNOSIS — N184 Chronic kidney disease, stage 4 (severe): Secondary | ICD-10-CM

## 2018-01-29 DIAGNOSIS — N189 Chronic kidney disease, unspecified: Secondary | ICD-10-CM | POA: Insufficient documentation

## 2018-01-29 LAB — IRON AND TIBC
IRON: 69 ug/dL (ref 28–170)
Saturation Ratios: 27 % (ref 10.4–31.8)
TIBC: 259 ug/dL (ref 250–450)
UIBC: 190 ug/dL

## 2018-01-29 LAB — POCT HEMOGLOBIN-HEMACUE: HEMOGLOBIN: 11.6 g/dL — AB (ref 12.0–15.0)

## 2018-01-29 LAB — FERRITIN: FERRITIN: 215 ng/mL (ref 11–307)

## 2018-01-29 MED ORDER — EPOETIN ALFA 20000 UNIT/ML IJ SOLN
INTRAMUSCULAR | Status: AC
  Administered 2018-01-29: 20000 [IU] via SUBCUTANEOUS
  Filled 2018-01-29: qty 1

## 2018-01-29 MED ORDER — EPOETIN ALFA 40000 UNIT/ML IJ SOLN: 30000.0000 [IU] | INTRAMUSCULAR | Status: DC

## 2018-01-29 MED ORDER — EPOETIN ALFA 10000 UNIT/ML IJ SOLN
INTRAMUSCULAR | Status: AC
  Administered 2018-01-29: 10000 [IU] via SUBCUTANEOUS
  Filled 2018-01-29: qty 1

## 2018-02-05 ENCOUNTER — Ambulatory Visit (HOSPITAL_COMMUNITY)
Admission: RE | Admit: 2018-02-05 | Discharge: 2018-02-05 | Disposition: A | Discharge status: Home / Self Care | Payer: Managed Care, Other (non HMO) | Source: Ambulatory Visit | Attending: Nephrology | Admitting: Nephrology

## 2018-02-05 VITALS — BP 127/90 | HR 78 | Temp 97.9°F | Resp 18

## 2018-02-05 DIAGNOSIS — D631 Anemia in chronic kidney disease: Secondary | ICD-10-CM | POA: Insufficient documentation

## 2018-02-05 DIAGNOSIS — N184 Chronic kidney disease, stage 4 (severe): Secondary | ICD-10-CM | POA: Insufficient documentation

## 2018-02-05 LAB — POCT HEMOGLOBIN-HEMACUE: Hemoglobin: 12.2 g/dL (ref 12.0–15.0)

## 2018-02-05 MED ORDER — EPOETIN ALFA 40000 UNIT/ML IJ SOLN: 30000.0000 [IU] | INTRAMUSCULAR | Status: DC

## 2018-02-12 ENCOUNTER — Other Ambulatory Visit (HOSPITAL_COMMUNITY): Payer: Self-pay

## 2018-02-13 ENCOUNTER — Ambulatory Visit (HOSPITAL_COMMUNITY): Payer: Managed Care, Other (non HMO)

## 2018-02-19 ENCOUNTER — Ambulatory Visit (HOSPITAL_COMMUNITY)
Admission: RE | Admit: 2018-02-19 | Discharge: 2018-02-19 | Disposition: A | Discharge status: Home / Self Care | Payer: Managed Care, Other (non HMO) | Source: Ambulatory Visit | Attending: Nephrology | Admitting: Nephrology

## 2018-02-19 VITALS — BP 136/81 | HR 71 | Temp 97.9°F | Resp 18 | Ht 60.5 in | Wt 179.0 lb

## 2018-02-19 DIAGNOSIS — D631 Anemia in chronic kidney disease: Secondary | ICD-10-CM | POA: Diagnosis not present

## 2018-02-19 DIAGNOSIS — Z5181 Encounter for therapeutic drug level monitoring: Secondary | ICD-10-CM | POA: Insufficient documentation

## 2018-02-19 DIAGNOSIS — N184 Chronic kidney disease, stage 4 (severe): Secondary | ICD-10-CM | POA: Diagnosis present

## 2018-02-19 DIAGNOSIS — Z79899 Other long term (current) drug therapy: Secondary | ICD-10-CM | POA: Insufficient documentation

## 2018-02-19 LAB — POCT HEMOGLOBIN-HEMACUE: Hemoglobin: 11.7 g/dL — ABNORMAL LOW (ref 12.0–15.0)

## 2018-02-19 MED ORDER — SODIUM CHLORIDE 0.9 % IV SOLN
510.0000 mg | Freq: Once | INTRAVENOUS | Status: AC
  Administered 2018-02-19: 510 mg via INTRAVENOUS
  Filled 2018-02-19: qty 17

## 2018-02-19 MED ORDER — EPOETIN ALFA 20000 UNIT/ML IJ SOLN
INTRAMUSCULAR | Status: AC
  Administered 2018-02-19: 20000 [IU] via SUBCUTANEOUS
  Filled 2018-02-19: qty 1

## 2018-02-19 MED ORDER — EPOETIN ALFA 10000 UNIT/ML IJ SOLN
INTRAMUSCULAR | Status: AC
  Administered 2018-02-19: 10000 [IU] via SUBCUTANEOUS
  Filled 2018-02-19: qty 1

## 2018-02-19 MED ORDER — EPOETIN ALFA 40000 UNIT/ML IJ SOLN: 30000.0000 [IU] | INTRAMUSCULAR | Status: DC

## 2018-02-20 ENCOUNTER — Encounter (HOSPITAL_COMMUNITY): Payer: Managed Care, Other (non HMO)

## 2018-02-25 ENCOUNTER — Other Ambulatory Visit (HOSPITAL_COMMUNITY): Payer: Self-pay

## 2018-02-26 ENCOUNTER — Ambulatory Visit (HOSPITAL_COMMUNITY)
Admission: RE | Admit: 2018-02-26 | Discharge: 2018-02-26 | Disposition: A | Discharge status: Home / Self Care | Payer: Managed Care, Other (non HMO) | Source: Ambulatory Visit | Attending: Nephrology | Admitting: Nephrology

## 2018-02-26 DIAGNOSIS — Z79899 Other long term (current) drug therapy: Secondary | ICD-10-CM | POA: Diagnosis not present

## 2018-02-26 DIAGNOSIS — D631 Anemia in chronic kidney disease: Secondary | ICD-10-CM | POA: Insufficient documentation

## 2018-02-26 DIAGNOSIS — Z5181 Encounter for therapeutic drug level monitoring: Secondary | ICD-10-CM | POA: Insufficient documentation

## 2018-02-26 DIAGNOSIS — N184 Chronic kidney disease, stage 4 (severe): Secondary | ICD-10-CM | POA: Diagnosis not present

## 2018-02-26 LAB — IRON AND TIBC
IRON: 50 ug/dL (ref 28–170)
Saturation Ratios: 21 % (ref 10.4–31.8)
TIBC: 237 ug/dL — AB (ref 250–450)
UIBC: 187 ug/dL

## 2018-02-26 LAB — POCT HEMOGLOBIN-HEMACUE: HEMOGLOBIN: 12.1 g/dL (ref 12.0–15.0)

## 2018-02-26 LAB — FERRITIN: FERRITIN: 404 ng/mL — AB (ref 11–307)

## 2018-02-26 MED ORDER — EPOETIN ALFA 40000 UNIT/ML IJ SOLN: 30000.0000 [IU] | INTRAMUSCULAR | Status: DC

## 2018-03-05 ENCOUNTER — Encounter (HOSPITAL_COMMUNITY): Payer: Managed Care, Other (non HMO)

## 2018-03-11 ENCOUNTER — Other Ambulatory Visit (HOSPITAL_COMMUNITY): Payer: Self-pay

## 2018-03-12 ENCOUNTER — Ambulatory Visit (HOSPITAL_COMMUNITY)
Admission: RE | Admit: 2018-03-12 | Discharge: 2018-03-12 | Disposition: A | Discharge status: Home / Self Care | Payer: Managed Care, Other (non HMO) | Source: Ambulatory Visit | Attending: Nephrology | Admitting: Nephrology

## 2018-03-12 ENCOUNTER — Encounter (HOSPITAL_COMMUNITY): Payer: Managed Care, Other (non HMO)

## 2018-03-12 VITALS — BP 128/96 | HR 74 | Temp 98.2°F | Resp 18 | Ht 61.0 in | Wt 179.0 lb

## 2018-03-12 DIAGNOSIS — D631 Anemia in chronic kidney disease: Secondary | ICD-10-CM | POA: Diagnosis not present

## 2018-03-12 DIAGNOSIS — N184 Chronic kidney disease, stage 4 (severe): Secondary | ICD-10-CM

## 2018-03-12 LAB — POCT HEMOGLOBIN-HEMACUE: HEMOGLOBIN: 12.9 g/dL (ref 12.0–15.0)

## 2018-03-12 MED ORDER — EPOETIN ALFA 40000 UNIT/ML IJ SOLN: 30000.0000 [IU] | INTRAMUSCULAR | Status: DC

## 2018-03-12 MED ORDER — SODIUM CHLORIDE 0.9 % IV SOLN
510.0000 mg | Freq: Once | INTRAVENOUS | Status: AC
  Administered 2018-03-12: 510 mg via INTRAVENOUS
  Filled 2018-03-12: qty 17

## 2018-03-19 ENCOUNTER — Encounter (HOSPITAL_COMMUNITY): Payer: Managed Care, Other (non HMO)

## 2018-03-26 ENCOUNTER — Encounter (HOSPITAL_COMMUNITY): Payer: Managed Care, Other (non HMO)

## 2018-03-27 ENCOUNTER — Ambulatory Visit (HOSPITAL_COMMUNITY)
Admission: RE | Admit: 2018-03-27 | Discharge: 2018-03-27 | Disposition: A | Discharge status: Home / Self Care | Payer: Managed Care, Other (non HMO) | Source: Ambulatory Visit | Attending: Nephrology | Admitting: Nephrology

## 2018-03-27 VITALS — BP 137/106 | HR 86 | Temp 97.8°F | Ht 61.0 in | Wt 179.0 lb

## 2018-03-27 DIAGNOSIS — N184 Chronic kidney disease, stage 4 (severe): Secondary | ICD-10-CM | POA: Diagnosis not present

## 2018-03-27 DIAGNOSIS — D631 Anemia in chronic kidney disease: Secondary | ICD-10-CM | POA: Diagnosis not present

## 2018-03-27 LAB — IRON AND TIBC
Iron: 96 ug/dL (ref 28–170)
Saturation Ratios: 44 % — ABNORMAL HIGH (ref 10.4–31.8)
TIBC: 220 ug/dL — ABNORMAL LOW (ref 250–450)
UIBC: 124 ug/dL

## 2018-03-27 LAB — FERRITIN: FERRITIN: 694 ng/mL — AB (ref 11–307)

## 2018-03-27 LAB — POCT HEMOGLOBIN-HEMACUE: HEMOGLOBIN: 12.1 g/dL (ref 12.0–15.0)

## 2018-03-27 MED ORDER — EPOETIN ALFA 40000 UNIT/ML IJ SOLN: 30000.0000 [IU] | INTRAMUSCULAR | Status: DC

## 2018-04-03 ENCOUNTER — Encounter (HOSPITAL_COMMUNITY): Payer: Managed Care, Other (non HMO)

## 2018-04-09 ENCOUNTER — Other Ambulatory Visit (HOSPITAL_COMMUNITY): Payer: Self-pay | Admitting: *Deleted

## 2018-04-10 ENCOUNTER — Ambulatory Visit (HOSPITAL_COMMUNITY)
Admission: RE | Admit: 2018-04-10 | Discharge: 2018-04-10 | Disposition: A | Discharge status: Home / Self Care | Payer: Managed Care, Other (non HMO) | Source: Ambulatory Visit | Attending: Nephrology | Admitting: Nephrology

## 2018-04-10 VITALS — BP 136/93 | HR 78 | Temp 97.6°F

## 2018-04-10 DIAGNOSIS — D631 Anemia in chronic kidney disease: Secondary | ICD-10-CM | POA: Insufficient documentation

## 2018-04-10 DIAGNOSIS — N184 Chronic kidney disease, stage 4 (severe): Secondary | ICD-10-CM

## 2018-04-10 LAB — POCT HEMOGLOBIN-HEMACUE: HEMOGLOBIN: 12.4 g/dL (ref 12.0–15.0)

## 2018-04-10 MED ORDER — EPOETIN ALFA 40000 UNIT/ML IJ SOLN: 30000.0000 [IU] | INTRAMUSCULAR | Status: DC

## 2018-04-24 ENCOUNTER — Ambulatory Visit (HOSPITAL_COMMUNITY)
Admission: RE | Admit: 2018-04-24 | Discharge: 2018-04-24 | Disposition: A | Discharge status: Home / Self Care | Payer: Managed Care, Other (non HMO) | Source: Ambulatory Visit | Attending: Nephrology | Admitting: Nephrology

## 2018-04-24 VITALS — BP 126/88 | HR 90 | Temp 98.0°F | Resp 12 | Ht 62.0 in | Wt 175.0 lb

## 2018-04-24 DIAGNOSIS — D631 Anemia in chronic kidney disease: Secondary | ICD-10-CM | POA: Diagnosis present

## 2018-04-24 DIAGNOSIS — N184 Chronic kidney disease, stage 4 (severe): Secondary | ICD-10-CM | POA: Diagnosis present

## 2018-04-24 LAB — IRON AND TIBC
IRON: 88 ug/dL (ref 28–170)
SATURATION RATIOS: 36 % — AB (ref 10.4–31.8)
TIBC: 245 ug/dL — AB (ref 250–450)
UIBC: 157 ug/dL

## 2018-04-24 LAB — POCT HEMOGLOBIN-HEMACUE: Hemoglobin: 11.8 g/dL — ABNORMAL LOW (ref 12.0–15.0)

## 2018-04-24 LAB — FERRITIN: Ferritin: 651 ng/mL — ABNORMAL HIGH (ref 11–307)

## 2018-04-24 MED ORDER — EPOETIN ALFA 10000 UNIT/ML IJ SOLN
INTRAMUSCULAR | Status: AC
  Administered 2018-04-24: 10000 [IU]
  Filled 2018-04-24: qty 1

## 2018-04-24 MED ORDER — EPOETIN ALFA 40000 UNIT/ML IJ SOLN: 30000.0000 [IU] | INTRAMUSCULAR | Status: DC

## 2018-04-24 MED ORDER — EPOETIN ALFA 20000 UNIT/ML IJ SOLN
INTRAMUSCULAR | Status: AC
  Administered 2018-04-24: 20000 [IU]
  Filled 2018-04-24: qty 1

## 2018-05-08 ENCOUNTER — Encounter (HOSPITAL_COMMUNITY)
Admission: RE | Admit: 2018-05-08 | Discharge: 2018-05-08 | Disposition: A | Discharge status: Home / Self Care | Payer: Managed Care, Other (non HMO) | Source: Ambulatory Visit | Attending: Nephrology | Admitting: Nephrology

## 2018-05-08 ENCOUNTER — Encounter (HOSPITAL_COMMUNITY): Payer: Managed Care, Other (non HMO)

## 2018-05-08 VITALS — BP 127/88 | HR 93 | Temp 97.8°F | Resp 18

## 2018-05-08 DIAGNOSIS — D631 Anemia in chronic kidney disease: Secondary | ICD-10-CM | POA: Insufficient documentation

## 2018-05-08 DIAGNOSIS — N184 Chronic kidney disease, stage 4 (severe): Secondary | ICD-10-CM | POA: Insufficient documentation

## 2018-05-08 LAB — POCT HEMOGLOBIN-HEMACUE: Hemoglobin: 10.9 g/dL — ABNORMAL LOW (ref 12.0–15.0)

## 2018-05-08 MED ORDER — EPOETIN ALFA 20000 UNIT/ML IJ SOLN
INTRAMUSCULAR | Status: AC
  Administered 2018-05-08: 20000 [IU]
  Filled 2018-05-08: qty 1

## 2018-05-08 MED ORDER — EPOETIN ALFA 40000 UNIT/ML IJ SOLN: 30000.0000 [IU] | INTRAMUSCULAR | Status: DC

## 2018-05-08 MED ORDER — EPOETIN ALFA 10000 UNIT/ML IJ SOLN
INTRAMUSCULAR | Status: AC
  Administered 2018-05-08: 10000 [IU]
  Filled 2018-05-08: qty 1

## 2018-05-22 ENCOUNTER — Ambulatory Visit (HOSPITAL_COMMUNITY)
Admission: RE | Admit: 2018-05-22 | Discharge: 2018-05-22 | Disposition: A | Discharge status: Home / Self Care | Payer: Managed Care, Other (non HMO) | Source: Ambulatory Visit | Attending: Nephrology | Admitting: Nephrology

## 2018-05-22 VITALS — BP 131/99 | HR 92 | Temp 97.5°F | Resp 20

## 2018-05-22 DIAGNOSIS — N184 Chronic kidney disease, stage 4 (severe): Secondary | ICD-10-CM | POA: Diagnosis present

## 2018-05-22 LAB — IRON AND TIBC
Iron: 51 ug/dL (ref 28–170)
Saturation Ratios: 22 % (ref 10.4–31.8)
TIBC: 237 ug/dL — AB (ref 250–450)
UIBC: 186 ug/dL

## 2018-05-22 LAB — FERRITIN: FERRITIN: 440 ng/mL — AB (ref 11–307)

## 2018-05-22 LAB — POCT HEMOGLOBIN-HEMACUE: Hemoglobin: 11.6 g/dL — ABNORMAL LOW (ref 12.0–15.0)

## 2018-05-22 MED ORDER — EPOETIN ALFA 20000 UNIT/ML IJ SOLN
INTRAMUSCULAR | Status: AC
  Administered 2018-05-22: 20000 [IU]
  Filled 2018-05-22: qty 1

## 2018-05-22 MED ORDER — EPOETIN ALFA 10000 UNIT/ML IJ SOLN
INTRAMUSCULAR | Status: AC
  Administered 2018-05-22: 10000 [IU]
  Filled 2018-05-22: qty 1

## 2018-05-22 MED ORDER — EPOETIN ALFA 40000 UNIT/ML IJ SOLN: 30000.0000 [IU] | INTRAMUSCULAR | Status: DC

## 2018-06-04 ENCOUNTER — Encounter (HOSPITAL_COMMUNITY)
Admission: RE | Admit: 2018-06-04 | Discharge: 2018-06-04 | Disposition: A | Discharge status: Home / Self Care | Payer: Managed Care, Other (non HMO) | Source: Ambulatory Visit | Attending: Nephrology | Admitting: Nephrology

## 2018-06-04 VITALS — BP 135/83 | HR 75 | Temp 98.6°F | Resp 20 | Ht 62.0 in | Wt 177.0 lb

## 2018-06-04 DIAGNOSIS — D631 Anemia in chronic kidney disease: Secondary | ICD-10-CM | POA: Insufficient documentation

## 2018-06-04 DIAGNOSIS — N184 Chronic kidney disease, stage 4 (severe): Secondary | ICD-10-CM | POA: Diagnosis present

## 2018-06-04 LAB — POCT HEMOGLOBIN-HEMACUE: Hemoglobin: 11.8 g/dL — ABNORMAL LOW (ref 12.0–15.0)

## 2018-06-04 MED ORDER — EPOETIN ALFA 20000 UNIT/ML IJ SOLN
INTRAMUSCULAR | Status: AC
  Administered 2018-06-04: 20000 [IU] via SUBCUTANEOUS
  Filled 2018-06-04: qty 1

## 2018-06-04 MED ORDER — EPOETIN ALFA 10000 UNIT/ML IJ SOLN
INTRAMUSCULAR | Status: AC
  Administered 2018-06-04: 10000 [IU] via SUBCUTANEOUS
  Filled 2018-06-04: qty 1

## 2018-06-04 MED ORDER — EPOETIN ALFA 40000 UNIT/ML IJ SOLN: 30000.0000 [IU] | INTRAMUSCULAR | Status: DC

## 2018-06-04 MED ORDER — SODIUM CHLORIDE 0.9 % IV SOLN
510.0000 mg | Freq: Once | INTRAVENOUS | Status: AC
  Administered 2018-06-04: 510 mg via INTRAVENOUS
  Filled 2018-06-04: qty 17

## 2018-06-05 ENCOUNTER — Encounter (HOSPITAL_COMMUNITY): Payer: Managed Care, Other (non HMO)

## 2018-06-18 ENCOUNTER — Encounter (HOSPITAL_COMMUNITY)
Admission: RE | Admit: 2018-06-18 | Discharge: 2018-06-18 | Disposition: A | Discharge status: Home / Self Care | Payer: Managed Care, Other (non HMO) | Source: Ambulatory Visit | Attending: Nephrology | Admitting: Nephrology

## 2018-06-18 VITALS — BP 123/91 | HR 83 | Temp 97.5°F | Resp 20

## 2018-06-18 DIAGNOSIS — N184 Chronic kidney disease, stage 4 (severe): Secondary | ICD-10-CM | POA: Diagnosis not present

## 2018-06-18 DIAGNOSIS — D631 Anemia in chronic kidney disease: Secondary | ICD-10-CM | POA: Insufficient documentation

## 2018-06-18 LAB — IRON AND TIBC
IRON: 67 ug/dL (ref 28–170)
Saturation Ratios: 31 % (ref 10.4–31.8)
TIBC: 217 ug/dL — AB (ref 250–450)
UIBC: 150 ug/dL

## 2018-06-18 LAB — FERRITIN: Ferritin: 610 ng/mL — ABNORMAL HIGH (ref 11–307)

## 2018-06-18 LAB — POCT HEMOGLOBIN-HEMACUE: HEMOGLOBIN: 12.7 g/dL (ref 12.0–15.0)

## 2018-06-18 MED ORDER — EPOETIN ALFA 10000 UNIT/ML IJ SOLN
INTRAMUSCULAR | Status: AC
  Filled 2018-06-18: qty 1

## 2018-06-18 MED ORDER — EPOETIN ALFA 20000 UNIT/ML IJ SOLN
INTRAMUSCULAR | Status: AC
  Filled 2018-06-18: qty 1

## 2018-06-18 MED ORDER — EPOETIN ALFA 40000 UNIT/ML IJ SOLN: 30000.0000 [IU] | INTRAMUSCULAR | Status: DC

## 2018-07-02 ENCOUNTER — Ambulatory Visit (HOSPITAL_COMMUNITY)
Admission: RE | Admit: 2018-07-02 | Discharge: 2018-07-02 | Disposition: A | Discharge status: Home / Self Care | Payer: Managed Care, Other (non HMO) | Source: Ambulatory Visit | Attending: Nephrology | Admitting: Nephrology

## 2018-07-02 ENCOUNTER — Encounter (HOSPITAL_COMMUNITY): Payer: Managed Care, Other (non HMO)

## 2018-07-02 VITALS — BP 134/89 | HR 71 | Resp 20

## 2018-07-02 DIAGNOSIS — D631 Anemia in chronic kidney disease: Secondary | ICD-10-CM | POA: Insufficient documentation

## 2018-07-02 DIAGNOSIS — N184 Chronic kidney disease, stage 4 (severe): Secondary | ICD-10-CM | POA: Insufficient documentation

## 2018-07-02 LAB — POCT HEMOGLOBIN-HEMACUE: HEMOGLOBIN: 12.8 g/dL (ref 12.0–15.0)

## 2018-07-02 MED ORDER — EPOETIN ALFA 40000 UNIT/ML IJ SOLN: 30000.0000 [IU] | INTRAMUSCULAR | Status: DC

## 2018-07-16 ENCOUNTER — Ambulatory Visit (HOSPITAL_COMMUNITY)
Admission: RE | Admit: 2018-07-16 | Discharge: 2018-07-16 | Disposition: A | Discharge status: Home / Self Care | Payer: Managed Care, Other (non HMO) | Source: Ambulatory Visit | Attending: Nephrology | Admitting: Nephrology

## 2018-07-16 VITALS — BP 124/86 | HR 81 | Resp 18

## 2018-07-16 DIAGNOSIS — N184 Chronic kidney disease, stage 4 (severe): Secondary | ICD-10-CM | POA: Diagnosis not present

## 2018-07-16 DIAGNOSIS — D631 Anemia in chronic kidney disease: Secondary | ICD-10-CM | POA: Diagnosis present

## 2018-07-16 LAB — POCT HEMOGLOBIN-HEMACUE: HEMOGLOBIN: 12.6 g/dL (ref 12.0–15.0)

## 2018-07-16 MED ORDER — EPOETIN ALFA 40000 UNIT/ML IJ SOLN: 30000.0000 [IU] | INTRAMUSCULAR | Status: DC

## 2018-07-30 ENCOUNTER — Ambulatory Visit (HOSPITAL_COMMUNITY)
Admission: RE | Admit: 2018-07-30 | Discharge: 2018-07-30 | Disposition: A | Discharge status: Home / Self Care | Payer: 59 | Source: Ambulatory Visit | Attending: Nephrology | Admitting: Nephrology

## 2018-07-30 VITALS — BP 142/85 | HR 87 | Temp 97.7°F | Resp 20

## 2018-07-30 DIAGNOSIS — N184 Chronic kidney disease, stage 4 (severe): Secondary | ICD-10-CM | POA: Insufficient documentation

## 2018-07-30 LAB — IRON AND TIBC
Iron: 80 ug/dL (ref 28–170)
Saturation Ratios: 37 % — ABNORMAL HIGH (ref 10.4–31.8)
TIBC: 217 ug/dL — ABNORMAL LOW (ref 250–450)
UIBC: 137 ug/dL

## 2018-07-30 LAB — FERRITIN: Ferritin: 884 ng/mL — ABNORMAL HIGH (ref 11–307)

## 2018-07-30 LAB — POCT HEMOGLOBIN-HEMACUE: HEMOGLOBIN: 12.3 g/dL (ref 12.0–15.0)

## 2018-07-30 MED ORDER — EPOETIN ALFA 40000 UNIT/ML IJ SOLN: 30000.0000 [IU] | INTRAMUSCULAR | Status: DC

## 2018-08-13 ENCOUNTER — Encounter (HOSPITAL_COMMUNITY)
Admission: RE | Admit: 2018-08-13 | Discharge: 2018-08-13 | Disposition: A | Discharge status: Home / Self Care | Payer: 59 | Source: Ambulatory Visit | Attending: Nephrology | Admitting: Nephrology

## 2018-08-13 VITALS — BP 126/88 | HR 98 | Temp 97.7°F | Resp 20

## 2018-08-13 DIAGNOSIS — D631 Anemia in chronic kidney disease: Secondary | ICD-10-CM | POA: Diagnosis not present

## 2018-08-13 DIAGNOSIS — N184 Chronic kidney disease, stage 4 (severe): Secondary | ICD-10-CM | POA: Insufficient documentation

## 2018-08-13 LAB — POCT HEMOGLOBIN-HEMACUE: Hemoglobin: 11.4 g/dL — ABNORMAL LOW (ref 12.0–15.0)

## 2018-08-13 MED ORDER — EPOETIN ALFA 40000 UNIT/ML IJ SOLN: 30000.0000 [IU] | INTRAMUSCULAR | Status: DC

## 2018-08-13 MED ORDER — EPOETIN ALFA 20000 UNIT/ML IJ SOLN
INTRAMUSCULAR | Status: AC
  Administered 2018-08-13: 20000 [IU] via SUBCUTANEOUS
  Filled 2018-08-13: qty 1

## 2018-08-13 MED ORDER — EPOETIN ALFA 10000 UNIT/ML IJ SOLN
INTRAMUSCULAR | Status: AC
  Administered 2018-08-13: 10000 [IU] via SUBCUTANEOUS
  Filled 2018-08-13: qty 1

## 2018-08-27 ENCOUNTER — Ambulatory Visit (HOSPITAL_COMMUNITY)
Admission: RE | Admit: 2018-08-27 | Discharge: 2018-08-27 | Disposition: A | Discharge status: Home / Self Care | Payer: 59 | Source: Ambulatory Visit | Attending: Nephrology | Admitting: Nephrology

## 2018-08-27 VITALS — BP 129/87 | HR 83 | Temp 98.1°F | Resp 20

## 2018-08-27 DIAGNOSIS — N184 Chronic kidney disease, stage 4 (severe): Secondary | ICD-10-CM | POA: Diagnosis present

## 2018-08-27 LAB — IRON AND TIBC
Iron: 76 ug/dL (ref 28–170)
Saturation Ratios: 34 % — ABNORMAL HIGH (ref 10.4–31.8)
TIBC: 225 ug/dL — ABNORMAL LOW (ref 250–450)
UIBC: 149 ug/dL

## 2018-08-27 LAB — POCT HEMOGLOBIN-HEMACUE: Hemoglobin: 11.4 g/dL — ABNORMAL LOW (ref 12.0–15.0)

## 2018-08-27 LAB — FERRITIN: Ferritin: 751 ng/mL — ABNORMAL HIGH (ref 11–307)

## 2018-08-27 MED ORDER — EPOETIN ALFA 10000 UNIT/ML IJ SOLN
INTRAMUSCULAR | Status: AC
  Administered 2018-08-27: 10000 [IU] via SUBCUTANEOUS
  Filled 2018-08-27: qty 1

## 2018-08-27 MED ORDER — EPOETIN ALFA 40000 UNIT/ML IJ SOLN: 30000.0000 [IU] | INTRAMUSCULAR | Status: DC

## 2018-08-27 MED ORDER — EPOETIN ALFA 20000 UNIT/ML IJ SOLN
INTRAMUSCULAR | Status: AC
  Administered 2018-08-27: 20000 [IU] via SUBCUTANEOUS
  Filled 2018-08-27: qty 1

## 2018-09-11 ENCOUNTER — Encounter (HOSPITAL_COMMUNITY)
Admission: RE | Admit: 2018-09-11 | Discharge: 2018-09-11 | Disposition: A | Discharge status: Home / Self Care | Payer: 59 | Source: Ambulatory Visit | Attending: Nephrology | Admitting: Nephrology

## 2018-09-11 DIAGNOSIS — N184 Chronic kidney disease, stage 4 (severe): Secondary | ICD-10-CM | POA: Insufficient documentation

## 2018-09-11 DIAGNOSIS — D631 Anemia in chronic kidney disease: Secondary | ICD-10-CM | POA: Insufficient documentation

## 2018-09-11 MED ORDER — EPOETIN ALFA 40000 UNIT/ML IJ SOLN: 30000.0000 [IU] | INTRAMUSCULAR | Status: DC

## 2018-09-12 LAB — POCT HEMOGLOBIN-HEMACUE: Hemoglobin: 12.2 g/dL (ref 12.0–15.0)

## 2018-09-25 ENCOUNTER — Ambulatory Visit (HOSPITAL_COMMUNITY)
Admission: RE | Admit: 2018-09-25 | Discharge: 2018-09-25 | Disposition: A | Discharge status: Home / Self Care | Payer: 59 | Source: Ambulatory Visit | Attending: Nephrology | Admitting: Nephrology

## 2018-09-25 VITALS — BP 129/85 | HR 87 | Temp 98.2°F | Resp 20

## 2018-09-25 DIAGNOSIS — N184 Chronic kidney disease, stage 4 (severe): Secondary | ICD-10-CM | POA: Diagnosis present

## 2018-09-25 LAB — IRON AND TIBC
Iron: 65 ug/dL (ref 28–170)
Saturation Ratios: 29 % (ref 10.4–31.8)
TIBC: 224 ug/dL — ABNORMAL LOW (ref 250–450)
UIBC: 159 ug/dL

## 2018-09-25 LAB — POCT HEMOGLOBIN-HEMACUE: Hemoglobin: 11.4 g/dL — ABNORMAL LOW (ref 12.0–15.0)

## 2018-09-25 LAB — FERRITIN: Ferritin: 740 ng/mL — ABNORMAL HIGH (ref 11–307)

## 2018-09-25 MED ORDER — EPOETIN ALFA 10000 UNIT/ML IJ SOLN
INTRAMUSCULAR | Status: AC
  Administered 2018-09-25: 10000 [IU] via SUBCUTANEOUS
  Filled 2018-09-25: qty 1

## 2018-09-25 MED ORDER — EPOETIN ALFA 20000 UNIT/ML IJ SOLN
INTRAMUSCULAR | Status: AC
  Administered 2018-09-25: 20000 [IU] via SUBCUTANEOUS
  Filled 2018-09-25: qty 1

## 2018-09-25 MED ORDER — EPOETIN ALFA 40000 UNIT/ML IJ SOLN: 30000.0000 [IU] | INTRAMUSCULAR | Status: DC

## 2018-10-09 ENCOUNTER — Ambulatory Visit (HOSPITAL_COMMUNITY)
Admission: RE | Admit: 2018-10-09 | Discharge: 2018-10-09 | Disposition: A | Discharge status: Home / Self Care | Payer: 59 | Source: Ambulatory Visit | Attending: Nephrology | Admitting: Nephrology

## 2018-10-09 VITALS — BP 126/85 | HR 80 | Temp 98.6°F | Resp 20

## 2018-10-09 DIAGNOSIS — N184 Chronic kidney disease, stage 4 (severe): Secondary | ICD-10-CM

## 2018-10-09 LAB — POCT HEMOGLOBIN-HEMACUE: Hemoglobin: 11.5 g/dL — ABNORMAL LOW (ref 12.0–15.0)

## 2018-10-09 MED ORDER — EPOETIN ALFA 40000 UNIT/ML IJ SOLN: 30000.0000 [IU] | INTRAMUSCULAR | Status: DC

## 2018-10-09 MED ORDER — EPOETIN ALFA 20000 UNIT/ML IJ SOLN
INTRAMUSCULAR | Status: AC
  Administered 2018-10-09: 20000 [IU]
  Filled 2018-10-09: qty 1

## 2018-10-09 MED ORDER — EPOETIN ALFA 10000 UNIT/ML IJ SOLN
INTRAMUSCULAR | Status: AC
  Administered 2018-10-09: 10000 [IU]
  Filled 2018-10-09: qty 1

## 2018-10-22 ENCOUNTER — Encounter (HOSPITAL_COMMUNITY): Payer: 59

## 2018-10-23 ENCOUNTER — Encounter (HOSPITAL_COMMUNITY): Payer: 59

## 2018-10-30 ENCOUNTER — Ambulatory Visit (HOSPITAL_COMMUNITY)
Admission: RE | Admit: 2018-10-30 | Discharge: 2018-10-30 | Disposition: A | Discharge status: Home / Self Care | Payer: 59 | Source: Ambulatory Visit | Attending: Nephrology | Admitting: Nephrology

## 2018-10-30 VITALS — BP 142/88 | HR 89 | Temp 98.4°F | Resp 20

## 2018-10-30 DIAGNOSIS — N184 Chronic kidney disease, stage 4 (severe): Secondary | ICD-10-CM

## 2018-10-30 LAB — IRON AND TIBC
Iron: 86 ug/dL (ref 28–170)
Saturation Ratios: 39 % — ABNORMAL HIGH (ref 10.4–31.8)
TIBC: 223 ug/dL — ABNORMAL LOW (ref 250–450)
UIBC: 137 ug/dL

## 2018-10-30 LAB — POCT HEMOGLOBIN-HEMACUE: Hemoglobin: 13 g/dL (ref 12.0–15.0)

## 2018-10-30 LAB — FERRITIN: FERRITIN: 599 ng/mL — AB (ref 11–307)

## 2018-10-30 MED ORDER — EPOETIN ALFA 40000 UNIT/ML IJ SOLN: 30000.0000 [IU] | INTRAMUSCULAR | Status: DC

## 2018-10-30 MED ORDER — EPOETIN ALFA 20000 UNIT/ML IJ SOLN
INTRAMUSCULAR | Status: AC
  Filled 2018-10-30: qty 1

## 2018-10-30 MED ORDER — EPOETIN ALFA 10000 UNIT/ML IJ SOLN
INTRAMUSCULAR | Status: AC
  Filled 2018-10-30: qty 1

## 2018-11-06 ENCOUNTER — Encounter (HOSPITAL_COMMUNITY): Payer: 59

## 2018-11-13 ENCOUNTER — Ambulatory Visit (HOSPITAL_COMMUNITY)
Admission: RE | Admit: 2018-11-13 | Discharge: 2018-11-13 | Disposition: A | Discharge status: Home / Self Care | Payer: 59 | Source: Ambulatory Visit | Attending: Nephrology | Admitting: Nephrology

## 2018-11-13 VITALS — BP 137/88 | HR 93 | Temp 98.0°F | Resp 20

## 2018-11-13 DIAGNOSIS — N184 Chronic kidney disease, stage 4 (severe): Secondary | ICD-10-CM | POA: Insufficient documentation

## 2018-11-13 LAB — POCT HEMOGLOBIN-HEMACUE: Hemoglobin: 11.4 g/dL — ABNORMAL LOW (ref 12.0–15.0)

## 2018-11-13 MED ORDER — EPOETIN ALFA 20000 UNIT/ML IJ SOLN
INTRAMUSCULAR | Status: AC
  Administered 2018-11-13: 20000 [IU] via SUBCUTANEOUS
  Filled 2018-11-13: qty 1

## 2018-11-13 MED ORDER — EPOETIN ALFA 10000 UNIT/ML IJ SOLN
INTRAMUSCULAR | Status: AC
  Administered 2018-11-13: 10000 [IU] via SUBCUTANEOUS
  Filled 2018-11-13: qty 1

## 2018-11-13 MED ORDER — EPOETIN ALFA 40000 UNIT/ML IJ SOLN: 30000.0000 [IU] | INTRAMUSCULAR | Status: DC

## 2018-11-27 ENCOUNTER — Encounter (HOSPITAL_COMMUNITY): Payer: 59
